# Patient Record
Sex: Female | Born: 1980 | Race: Black or African American | Hispanic: No | Marital: Single | Smoking: Current every day smoker
Health system: Southern US, Community
[De-identification: ages and names within clinical notes are randomized; demographics above are authoritative.]

## PROBLEM LIST (undated history)

## (undated) DIAGNOSIS — D649 Anemia, unspecified: Secondary | ICD-10-CM

## (undated) HISTORY — DX: Anemia, unspecified: D64.9

---

## 2019-07-03 ENCOUNTER — Emergency Department (HOSPITAL_COMMUNITY)
Admission: EM | Admit: 2019-07-03 | Discharge: 2019-07-03 | Disposition: A | Discharge status: Home / Self Care | Payer: Self-pay | Attending: Emergency Medicine | Admitting: Emergency Medicine

## 2019-07-03 ENCOUNTER — Other Ambulatory Visit: Payer: Self-pay

## 2019-07-03 DIAGNOSIS — K0889 Other specified disorders of teeth and supporting structures: Secondary | ICD-10-CM | POA: Insufficient documentation

## 2019-07-03 MED ORDER — NAPROXEN 250 MG PO TABS
500.0000 mg | ORAL_TABLET | Freq: Once | ORAL | Status: AC
  Administered 2019-07-03: 500 mg via ORAL
  Filled 2019-07-03: qty 2

## 2019-07-03 MED ORDER — PENICILLIN V POTASSIUM 250 MG PO TABS
500.0000 mg | ORAL_TABLET | Freq: Once | ORAL | Status: AC
  Administered 2019-07-03: 500 mg via ORAL
  Filled 2019-07-03: qty 2

## 2019-07-03 MED ORDER — NAPROXEN 500 MG PO TABS: 500.0000 mg | ORAL_TABLET | Freq: Two times a day (BID) | ORAL | 0 refills | Status: DC

## 2019-07-03 MED ORDER — PENICILLIN V POTASSIUM 500 MG PO TABS: 500.0000 mg | ORAL_TABLET | Freq: Four times a day (QID) | ORAL | 0 refills | Status: AC

## 2019-07-03 NOTE — ED Provider Notes (Signed)
[facility] EMERGENCY DEPARTMENT Provider Note   CSN: [id number] Arrival date & time: 07/03/19  1551     History   Chief Complaint Chief Complaint  Patient presents with  . Dental Pain    HPI Connie Mcgee is a 38 y.o. female who presents with dental pain.  No significant past medical history.  Patient states that she has pain in her upper and lower molars on the left side.  The pain started 2 days ago.  The pain is constant and worse with eating. It radiates to her L ear.  Nothing makes it better.  She has not tried any medicines over-the-counter.  She reports associated swelling.  No fevers.  She does have a dentist but has not seen them in a couple years.     HPI  No past medical history on file.  There are no active problems to display for this patient.    The histories are not reviewed yet. Please review them in the "History" navigator section and refresh this [place].   OB History   No obstetric history on file.      Home Medications    Prior to Admission medications   Not on File    Family History No family history on file.  Social History Social History   Tobacco Use  . Smoking status: Not on file  Substance Use Topics  . Alcohol use: Not on file  . Drug use: Not on file     Allergies   Patient has no allergy information on record.   Review of Systems Review of Systems  Constitutional: Negative for fever.  HENT: Positive for dental problem.      Physical Exam Updated Vital Signs BP 135/83 (BP Location: Right Arm)   Pulse 87   Temp 99.1 F (37.3 C) (Oral)   Resp 16   LMP 06/10/2019   SpO2 100%   Physical Exam Vitals signs and nursing note reviewed.  Constitutional:      General: She is not in acute distress.    Appearance: Normal appearance. She is well-developed. She is not ill-appearing.  HENT:     Head: Normocephalic and atraumatic.     Jaw: Trismus (mild) present.     Right Ear: Tympanic membrane  normal.     Left Ear: Tympanic membrane normal.     Mouth/Throat:     Lips: Pink.     Mouth: Mucous membranes are moist.     Dentition: Abnormal dentition (left lower wisdom tooth is broken).     Pharynx: Oropharynx is clear.  Eyes:     General: No scleral icterus.       Right eye: No discharge.        Left eye: No discharge.     Conjunctiva/sclera: Conjunctivae normal.     Pupils: Pupils are equal, round, and reactive to light.  Neck:     Musculoskeletal: Normal range of motion.  Cardiovascular:     Rate and Rhythm: Normal rate.  Pulmonary:     Effort: Pulmonary effort is normal. No respiratory distress.  Abdominal:     General: There is no distension.  Skin:    General: Skin is warm and dry.  Neurological:     Mental Status: She is alert and oriented to person, place, and time.  Psychiatric:        Behavior: Behavior normal.      ED Treatments / Results  Labs (all labs ordered are listed, but only abnormal results  are displayed) Labs Reviewed - No data to display  EKG None  Radiology No results found.  Procedures Procedures (including critical care time)  Medications Ordered in ED Medications - No data to display   Initial Impression / Assessment and Plan / ED Course  I have reviewed the triage vital signs and the nursing notes.  Pertinent labs & imaging results that were available during my care of the patient were reviewed by me and considered in my medical decision making (see chart for details).  Dental pain associated with dental caries and possible dental infection. Patient is afebrile, non toxic appearing, and swallowing secretions well. No concerning findings on exam. No obvious abscess and doubt deep space head or neck infection.  She has a Education officer, communitydentist and was encouraged to follow up with them. She was given an rx for PCN and Naproxen. Pt given return precautions.  Pt verbalizes understanding and agrees with plan.   Final Clinical Impressions(s) / ED  Diagnoses   Final diagnoses:  Pain, dental    ED Discharge Orders    None       Bethel BornGekas, Elmer Merwin Marie, PA-C 07/03/19 1814    Blane OharaZavitz, Joshua, MD 07/04/19 0028

## 2019-07-03 NOTE — Discharge Instructions (Signed)
Take Penicillin 4 times daily for the next week Take Naproxen twice a day for the next week for pain and swelling Please follow up with your dentist

## 2019-07-03 NOTE — ED Triage Notes (Signed)
Per pt she has been having teeth pain in the left upper jaw and bottom lower jaw. It has been hurting for 2 days now. Swelling in the jaw and hard to chew.

## 2020-11-30 ENCOUNTER — Encounter: Payer: Self-pay | Admitting: Obstetrics and Gynecology

## 2020-12-10 NOTE — L&D Delivery Note (Signed)
OB/GYN Faculty Practice Delivery Note  Connie Mcgee is a 40 y.o. G4P3 s/p vaginal delivery at [redacted]w[redacted]d. She was admitted for IOL secondary to [name].   ROM: 9h 40m with  fluid GBS Status: negative Maximum Maternal Temperature: 98.27F  Labor Progress: On admission, FB was placed and pitocin was started. AROM for clear fluid at 2055 on 5/10. She then progressed to complete cervical dilation with up-titration of pitocin. She then had an uncomplicated delivery as noted below.  Delivery Date/Time: 04/19/21 on 0620 Delivery: Called to room and patient was complete and pushing. Head delivered LOA. Tight nuchal cord x2 present; reduced s/p delivery. Shoulder and body delivered in usual fashion. Infant with spontaneous cry, placed on mother's abdomen, dried and stimulated. Cord clamped x 2 after 1-minute delay, and cut by author under my direct supervision. Cord blood drawn. Placenta delivered spontaneously with gentle cord traction. Fundus firm with massage and Pitocin. Labia, perineum, vagina, and cervix were inspected, without evidence of lacerations.  Placenta: 3-vessel cord, intact, sent to L&D Complications: none Lacerations: none EBL: 50 ml Analgesia: epidural  Infant: viable female  APGARs 8 & 9  weight 3055 g  Post-Placental IUD Insertion Procedure Note  Patient identified, informed consent signed prior to delivery, signed copy in chart, time out was performed.    Vaginal, labial and perineal areas thoroughly inspected, without evidence for lacerations.  - Liletta IUD grasped between sterile gloved fingers. Sterile lubrication applied to sterile gloved hand for ease of insertion. Fundus identified through abdominal wall using non-insertion hand. IUD inserted to fundus with bimanual technique. IUD carefully released at the fundus and insertion hand gently removed from vagina.   Strings trimmed to the level of the introitus. Patient tolerated procedure well.  Lot #: [id number] Exp:  06/08/2024  Patient given post procedure instructions and IUD care card with expiration date.  Patient is asked to keep IUD strings tucked in her vagina until her postpartum follow up visit in 4-6 weeks. Patient advised to abstain from sexual intercourse and pulling on strings before her follow-up visit. Patient verbalized an understanding of the plan of care and agrees.   [name], MD OB Fellow, Faculty Practice 04/19/2021 9:18 AM

## 2020-12-20 ENCOUNTER — Other Ambulatory Visit: Payer: Self-pay

## 2020-12-20 ENCOUNTER — Ambulatory Visit (INDEPENDENT_AMBULATORY_CARE_PROVIDER_SITE_OTHER): Payer: Medicaid Other | Admitting: Obstetrics and Gynecology

## 2020-12-20 ENCOUNTER — Encounter: Payer: Self-pay | Admitting: Obstetrics and Gynecology

## 2020-12-20 VITALS — BP 116/76 | HR 90 | Ht 62.0 in | Wt 180.0 lb

## 2020-12-20 DIAGNOSIS — Z8759 Personal history of other complications of pregnancy, childbirth and the puerperium: Secondary | ICD-10-CM

## 2020-12-20 DIAGNOSIS — O0990 Supervision of high risk pregnancy, unspecified, unspecified trimester: Secondary | ICD-10-CM | POA: Insufficient documentation

## 2020-12-20 DIAGNOSIS — Z98891 History of uterine scar from previous surgery: Secondary | ICD-10-CM | POA: Insufficient documentation

## 2020-12-20 NOTE — Progress Notes (Signed)
Subjective:  Connie Mcgee is a 40 y.o. G4P3 at [redacted]w[redacted]d being seen today for ongoing prenatal care. Transferred from the [facility] d/t H/O IUGR in the past. H/O c section in 2008 d/t persistent OP.  She is currently monitored for the following issues for this high-risk pregnancy and has Supervision of high risk pregnancy, antepartum and History of cesarean section on their problem list.  Patient reports no complaints.  Contractions: Not present. Vag. Bleeding: None.  Movement: Present. Denies leaking of fluid.   The following portions of the patient's history were reviewed and updated as appropriate: allergies, current medications, past family history, past medical history, past social history, past surgical history and problem list. Problem list updated.  Objective:   Vitals:   12/20/20 1641 12/20/20 1644  BP: 116/76   Pulse: 90   Weight: 180 lb (81.6 kg)   Height:  5\' 2"  (1.575 m)    Fetal Status: Fetal Heart Rate (bpm): 155   Movement: Present     General:  Alert, oriented and cooperative. Patient is in no acute distress.  Skin: Skin is warm and dry. No rash noted.   Cardiovascular: Normal heart rate noted  Respiratory: Normal respiratory effort, no problems with respiration noted  Abdomen: Soft, gravid, appropriate for gestational age. Pain/Pressure: Present     Pelvic:  Cervical exam deferred        Extremities: Normal range of motion.     Mental Status: Normal mood and affect. Normal behavior. Normal judgment and thought content.   Urinalysis:      Assessment and Plan:  Pregnancy: G4P3 at [redacted]w[redacted]d  1. Supervision of high risk pregnancy, antepartum Stable Glucola next visit Growth scan ordered  2. History of cesarean section Signed for released of medical records for OP  To discuss in more detailed at later  3. History of prior pregnancy with IUGR newborn  - [redacted]w[redacted]d MFM OB DETAIL +14 WK; Future  Preterm labor symptoms and general obstetric precautions including but not  limited to vaginal bleeding, contractions, leaking of fluid and fetal movement were reviewed in detail with the patient. Please refer to After Visit Summary for other counseling recommendations.  Return in about 4 weeks (around 01/17/2021) for ROB w/ glucose test, OB visit, face to face, MD only.   03/17/2021, MD

## 2020-12-20 NOTE — Patient Instructions (Signed)

## 2021-01-17 ENCOUNTER — Other Ambulatory Visit: Payer: Self-pay

## 2021-01-17 ENCOUNTER — Other Ambulatory Visit: Payer: Medicaid Other

## 2021-01-17 ENCOUNTER — Ambulatory Visit (INDEPENDENT_AMBULATORY_CARE_PROVIDER_SITE_OTHER): Payer: Medicaid Other | Admitting: Obstetrics and Gynecology

## 2021-01-17 VITALS — BP 113/76 | HR 108 | Wt 184.3 lb

## 2021-01-17 DIAGNOSIS — Z23 Encounter for immunization: Secondary | ICD-10-CM | POA: Diagnosis not present

## 2021-01-17 DIAGNOSIS — O0990 Supervision of high risk pregnancy, unspecified, unspecified trimester: Secondary | ICD-10-CM

## 2021-01-17 DIAGNOSIS — Z98891 History of uterine scar from previous surgery: Secondary | ICD-10-CM

## 2021-01-17 DIAGNOSIS — Z3A26 26 weeks gestation of pregnancy: Secondary | ICD-10-CM

## 2021-01-17 NOTE — Progress Notes (Signed)
Patient presents for ROB and GTT. Patient has no concerns today. Tdap vaccine given today.

## 2021-01-17 NOTE — Progress Notes (Signed)
   PRENATAL VISIT NOTE  Subjective:  Connie Mcgee is a 40 y.o. G4P3 at [redacted]w[redacted]d being seen today for ongoing prenatal care.  She is currently monitored for the following issues for this high-risk pregnancy and has Supervision of high risk pregnancy, antepartum; History of cesarean section; and [redacted] weeks gestation of pregnancy on their problem list.  Patient doing well with no acute concerns today. She reports no complaints.  Contractions: Not present. Vag. Bleeding: None.  Movement: Present. Denies leaking of fluid.   The following portions of the patient's history were reviewed and updated as appropriate: allergies, current medications, past family history, past medical history, past social history, past surgical history and problem list. Problem list updated.  Objective:   Vitals:   01/17/21 0854  BP: 113/76  Pulse: (!) 108  Weight: 184 lb 4.8 oz (83.6 kg)    Fetal Status: Fetal Heart Rate (bpm): 153   Movement: Present     General:  Alert, oriented and cooperative. Patient is in no acute distress.  Skin: Skin is warm and dry. No rash noted.   Cardiovascular: Normal heart rate noted  Respiratory: Normal respiratory effort, no problems with respiration noted  Abdomen: Soft, gravid, appropriate for gestational age.  Pain/Pressure: Absent     Pelvic: Cervical exam deferred        Extremities: Normal range of motion.  Edema: None  Mental Status:  Normal mood and affect. Normal behavior. Normal judgment and thought content.   Assessment and Plan:  Pregnancy: G4P3 at [redacted]w[redacted]d  1. Supervision of high risk pregnancy, antepartum  - Glucose Tolerance, 2 Hours w/1 Hour - CBC - HIV Antibody (routine testing w rflx) - RPR - Tdap vaccine greater than or equal to 7yo IM  2. History of cesarean section Discussed repeat c/s versus VBAC in detail.  Pt good candidate for either.  She will think about it and discuss with family.  Can get formal consult and consent next visit  3. [redacted] weeks  gestation of pregnancy   Preterm labor symptoms and general obstetric precautions including but not limited to vaginal bleeding, contractions, leaking of fluid and fetal movement were reviewed in detail with the patient.  Please refer to After Visit Summary for other counseling recommendations.   Return in about 2 weeks (around 01/31/2021) for [facility], in person.   Mariel Aloe, MD

## 2021-01-17 NOTE — Patient Instructions (Signed)
Vaginal Birth After Cesarean Delivery  Vaginal birth after cesarean delivery (VBAC) is giving birth vaginally after previously delivering a baby through a cesarean section (C-section). A VBAC may be a safe option for you, depending on your health and other factors. It is important to discuss VBAC with your health care provider early in your pregnancy so you can understand the risks, benefits, and options. Having these discussions early will give you time to make your birth plan. Who are the best candidates for VBAC? The best candidates for VBAC are women who:  Have had one or two prior cesarean deliveries, and the incision made during the delivery was horizontal (low transverse).  Do not have a vertical (classical) scar on their uterus.  Have not had a tear in the wall of their uterus (uterine rupture).  Plan to have more pregnancies. A VBAC is also more likely to be successful:  In women who have previously given birth vaginally.  When labor starts by itself (spontaneously) before the due date. What are the benefits of VBAC? The benefits of delivering your baby vaginally instead of by a cesarean delivery include:  A shorter hospital stay.  A faster recovery time.  Less pain.  Avoiding risks associated with major surgery, such as infection and blood clots.  Less blood loss and less need for donated blood (transfusions). What are the risks of VBAC? The main risk of attempting a VBAC is that it may fail, forcing your health care provider to deliver your baby by a C-section. Other risks are rare and include:  Tearing (rupture) of the scar from a past cesarean delivery.  Other risks associated with vaginal deliveries. If a repeat cesarean delivery is needed, the risks include:  Blood loss.  Infection.  Blood clot.  Damage to surrounding organs.  Removal of the uterus (hysterectomy), if it is damaged.  Placenta problems in future pregnancies. What else should I know  about my options? Delivering a baby through a VBAC is similar to having a normal spontaneous vaginal delivery. Therefore, it is safe:  To try with twins.  For your health care provider to try to turn the baby from a breech position (external cephalic version) during labor.  With epidural analgesia for pain relief. Consider where you would like to deliver your baby. VBAC should be attempted in facilities where an emergency cesarean delivery can be performed. VBAC is not recommended for home births. Any changes in your health or your baby's health during your pregnancy may make it necessary to change your initial decision about VBAC. Your health care provider may recommend that you do not attempt a VBAC if:  Your baby's suspected weight is 8.8 lb (4 kg) or more.  You have preeclampsia. This is a condition that causes high blood pressure along with other symptoms, such as swelling and headaches.  You will have VBAC less than 19 months after your cesarean delivery.  You are past your due date.  You need to have labor started (induced) because your cervix is not ready for labor (unfavorable). Where to find more information  American Pregnancy Association: americanpregnancy.org  American Congress of Obstetricians and Gynecologists: acog.org Summary  Vaginal birth after cesarean delivery (VBAC) is giving birth vaginally after previously delivering a baby through a cesarean section (C-section). A VBAC may be a safe option for you, depending on your health and other factors.  Discuss VBAC with your health care provider early in your pregnancy so you can understand the risks, benefits, options, and   have plenty of time to make your birth plan.  The main risk of attempting a VBAC is that it may fail, forcing your health care provider to deliver your baby by a C-section. Other risks are rare. This information is not intended to replace advice given to you by your health care provider. Make sure  you discuss any questions you have with your health care provider. Document Revised: 03/24/2019 Document Reviewed: 03/05/2017 Elsevier Patient Education  2021 Elsevier Inc.  

## 2021-01-18 ENCOUNTER — Other Ambulatory Visit: Payer: Self-pay

## 2021-01-18 DIAGNOSIS — O24410 Gestational diabetes mellitus in pregnancy, diet controlled: Secondary | ICD-10-CM

## 2021-01-18 LAB — CBC
Hematocrit: 29.9 % — ABNORMAL LOW (ref 34.0–46.6)
Hemoglobin: 9.7 g/dL — ABNORMAL LOW (ref 11.1–15.9)
MCH: 29 pg (ref 26.6–33.0)
MCHC: 32.4 g/dL (ref 31.5–35.7)
MCV: 89 fL (ref 79–97)
Platelets: 271 10*3/uL (ref 150–450)
RBC: 3.35 x10E6/uL — ABNORMAL LOW (ref 3.77–5.28)
RDW: 12.6 % (ref 11.7–15.4)
WBC: 9.4 10*3/uL (ref 3.4–10.8)

## 2021-01-18 LAB — GLUCOSE TOLERANCE, 2 HOURS W/ 1HR
Glucose, 1 hour: 185 mg/dL — ABNORMAL HIGH (ref 65–179)
Glucose, 2 hour: 167 mg/dL — ABNORMAL HIGH (ref 65–152)
Glucose, Fasting: 98 mg/dL — ABNORMAL HIGH (ref 65–91)

## 2021-01-18 LAB — HIV ANTIBODY (ROUTINE TESTING W REFLEX): HIV Screen 4th Generation wRfx: NONREACTIVE

## 2021-01-18 LAB — RPR: RPR Ser Ql: NONREACTIVE

## 2021-01-18 MED ORDER — ACCU-CHEK GUIDE VI STRP: ORAL_STRIP | 12 refills | Status: DC

## 2021-01-18 MED ORDER — ACCU-CHEK GUIDE W/DEVICE KIT: 1.0000 | PACK | Freq: Four times a day (QID) | 0 refills | Status: AC

## 2021-01-18 MED ORDER — ACCU-CHEK SOFTCLIX LANCETS MISC: 2 refills | Status: DC

## 2021-01-18 NOTE — Telephone Encounter (Signed)
Patient has been informed of test results and referral place to Diabetes and Nutrition.

## 2021-01-31 ENCOUNTER — Other Ambulatory Visit: Payer: Self-pay

## 2021-01-31 ENCOUNTER — Ambulatory Visit: Payer: Medicaid Other | Admitting: Registered"

## 2021-01-31 ENCOUNTER — Encounter: Payer: Medicaid Other | Admitting: Obstetrics & Gynecology

## 2021-01-31 ENCOUNTER — Encounter: Payer: Medicaid Other | Attending: Obstetrics and Gynecology | Admitting: Registered"

## 2021-01-31 DIAGNOSIS — O24410 Gestational diabetes mellitus in pregnancy, diet controlled: Secondary | ICD-10-CM | POA: Insufficient documentation

## 2021-01-31 DIAGNOSIS — O24419 Gestational diabetes mellitus in pregnancy, unspecified control: Secondary | ICD-10-CM

## 2021-02-01 ENCOUNTER — Other Ambulatory Visit: Payer: Self-pay

## 2021-02-01 ENCOUNTER — Ambulatory Visit: Payer: Medicaid Other | Admitting: *Deleted

## 2021-02-01 ENCOUNTER — Ambulatory Visit: Payer: Medicaid Other | Attending: Obstetrics and Gynecology

## 2021-02-01 ENCOUNTER — Ambulatory Visit (INDEPENDENT_AMBULATORY_CARE_PROVIDER_SITE_OTHER): Payer: Medicaid Other | Admitting: Obstetrics and Gynecology

## 2021-02-01 ENCOUNTER — Encounter: Payer: Self-pay | Admitting: Obstetrics and Gynecology

## 2021-02-01 VITALS — BP 120/68 | HR 106

## 2021-02-01 VITALS — BP 100/68 | HR 103 | Wt 186.1 lb

## 2021-02-01 DIAGNOSIS — O24410 Gestational diabetes mellitus in pregnancy, diet controlled: Secondary | ICD-10-CM | POA: Diagnosis not present

## 2021-02-01 DIAGNOSIS — O99333 Smoking (tobacco) complicating pregnancy, third trimester: Secondary | ICD-10-CM

## 2021-02-01 DIAGNOSIS — O24419 Gestational diabetes mellitus in pregnancy, unspecified control: Secondary | ICD-10-CM

## 2021-02-01 DIAGNOSIS — Z8759 Personal history of other complications of pregnancy, childbirth and the puerperium: Secondary | ICD-10-CM | POA: Insufficient documentation

## 2021-02-01 DIAGNOSIS — Z363 Encounter for antenatal screening for malformations: Secondary | ICD-10-CM | POA: Diagnosis not present

## 2021-02-01 DIAGNOSIS — O0933 Supervision of pregnancy with insufficient antenatal care, third trimester: Secondary | ICD-10-CM | POA: Diagnosis not present

## 2021-02-01 DIAGNOSIS — Z98891 History of uterine scar from previous surgery: Secondary | ICD-10-CM

## 2021-02-01 DIAGNOSIS — O34219 Maternal care for unspecified type scar from previous cesarean delivery: Secondary | ICD-10-CM

## 2021-02-01 DIAGNOSIS — O09529 Supervision of elderly multigravida, unspecified trimester: Secondary | ICD-10-CM

## 2021-02-01 DIAGNOSIS — O321XX0 Maternal care for breech presentation, not applicable or unspecified: Secondary | ICD-10-CM

## 2021-02-01 DIAGNOSIS — O0990 Supervision of high risk pregnancy, unspecified, unspecified trimester: Secondary | ICD-10-CM

## 2021-02-01 DIAGNOSIS — O09293 Supervision of pregnancy with other poor reproductive or obstetric history, third trimester: Secondary | ICD-10-CM

## 2021-02-01 DIAGNOSIS — Z3A28 28 weeks gestation of pregnancy: Secondary | ICD-10-CM

## 2021-02-01 DIAGNOSIS — O09523 Supervision of elderly multigravida, third trimester: Secondary | ICD-10-CM

## 2021-02-01 NOTE — Progress Notes (Signed)
Pt does not have blood sugars with her. Pt states she took blood sugar around 1:30pm yesterday and the reading was 109

## 2021-02-01 NOTE — Progress Notes (Signed)
   PRENATAL VISIT NOTE  Subjective:  Connie Mcgee is a 40 y.o. G4P3 at 91w1dbeing seen today for ongoing prenatal care.  She is currently monitored for the following issues for this high-risk pregnancy and has Supervision of high risk pregnancy, antepartum; History of cesarean section; [redacted] weeks gestation of pregnancy; and Gestational diabetes mellitus (GDM), antepartum on their problem list.  Patient reports no complaints.  Contractions: Not present. Vag. Bleeding: None.  Movement: Present. Denies leaking of fluid.   The following portions of the patient's history were reviewed and updated as appropriate: allergies, current medications, past family history, past medical history, past social history, past surgical history and problem list.   Objective:   Vitals:   02/01/21 0903  BP: 100/68  Pulse: (!) 103  Weight: 186 lb 1.6 oz (84.4 kg)    Fetal Status: Fetal Heart Rate (bpm): 154   Movement: Present     General:  Alert, oriented and cooperative. Patient is in no acute distress.  Skin: Skin is warm and dry. No rash noted.   Cardiovascular: Normal heart rate noted  Respiratory: Normal respiratory effort, no problems with respiration noted  Abdomen: Soft, gravid, appropriate for gestational age.  Pain/Pressure: Absent     Pelvic: Cervical exam deferred        Extremities: Normal range of motion.     Mental Status: Normal mood and affect. Normal behavior. Normal judgment and thought content.   Assessment and Plan:  Pregnancy: G4P3 at 273w1d1. History of cesarean section - op report not available - pt reports she was induced for post dates and had CS due to face presentation - was never told she could not have TOLAC - Reviewed risks/benefits of TOLAC versus RCS in detail. Patient counseled regarding potential vaginal delivery, chance of success, future implications, possible uterine rupture and need for urgent/emergent repeat cesarean. Counseled regarding potential need for  repeat c-section for reasons unrelated to first c-section. Counseled regarding scheduled repeat cesarean including risks of bleeding, infection, damage to surrounding tissue, abnormal placentation, implications for future pregnancies. All questions answered.  Patient desires TOLAC, consent signed 02/01/2021.  2. Supervision of high risk pregnancy, antepartum  3. Gestational diabetes mellitus (GDM), antepartum, gestational diabetes method of control unspecified - Reports fasting range 79-110 - Has not been checking post prandial - Met with AnLevada Dyesterday and had counseling about importance of checking 4x daily and improving diet  4. [redacted] weeks gestation of pregnancy   Preterm labor symptoms and general obstetric precautions including but not limited to vaginal bleeding, contractions, leaking of fluid and fetal movement were reviewed in detail with the patient. Please refer to After Visit Summary for other counseling recommendations.   Return in about 2 weeks (around 02/15/2021) for high OB, in person.  Future Appointments  Date Time Provider [place]2/23/2022  2:00 PM [facility]/2022  2:15 PM [facility] US2 [facility]  KeSloan LeiterMD

## 2021-02-01 NOTE — Progress Notes (Signed)
Patient was seen on 01/31/21 for Gestational Diabetes self-management. EDD 04/25/21. Patient states no history of GDM. Diet history obtained. Patient eats variety of all food groups and has cut back sweets and stopped drinking soda. Beverages include water.  Patient reports no structured physical activity, states she has a long hallway and walks it to use bathroom 10-15 times per day.  The following learning objectives were met by the patient :   States the definition of Gestational Diabetes  States why dietary management is important in controlling blood glucose  Describes the effects of carbohydrates on blood glucose levels  Demonstrates ability to create a balanced meal plan  Demonstrates carbohydrate counting   States when to check blood glucose levels  Demonstrates proper blood glucose monitoring techniques  States the effect of stress and exercise on blood glucose levels  States the importance of limiting caffeine and abstaining from alcohol and smoking  Plan:  Aim for 3 Carbohydrate Choices per meal (45 grams) +/- 1 either way  Aim for 1-2 Carbohydrate Choices per snack Begin reading food labels for Total Carbohydrate of foods If OK with your MD, consider  increasing your activity level by walking, Arm Chair Exercises or other activity daily as tolerated Begin checking Blood Glucose before breakfast and 2 hours after first bite of breakfast, lunch and dinner as directed by MD  Bring Log Book/Sheet and meter to every medical appointment  Baby Scripts: (BS 2.0 not capable of glucose management at this time.) Patient to record blood sugar on glucose log sheet  Take medication if directed by MD  Patient already has a meter, is testing fasting and randomly in the afternoon. Pt reports reading range 75-109 mg/dL  Patient instructed to monitor glucose levels: FBS: 60 - 95 mg/dl 2 hour: <120 mg/dl  Patient received the following handouts:  Nutrition Diabetes and  Pregnancy  Carbohydrate Counting List  Blood glucose Log Sheet  Patient will be seen for follow-up as needed. 

## 2021-02-02 ENCOUNTER — Other Ambulatory Visit: Payer: Self-pay | Admitting: *Deleted

## 2021-02-02 DIAGNOSIS — O24410 Gestational diabetes mellitus in pregnancy, diet controlled: Secondary | ICD-10-CM

## 2021-02-15 ENCOUNTER — Encounter: Payer: Medicaid Other | Admitting: Obstetrics and Gynecology

## 2021-02-21 ENCOUNTER — Ambulatory Visit (INDEPENDENT_AMBULATORY_CARE_PROVIDER_SITE_OTHER): Payer: Medicaid Other | Admitting: Obstetrics and Gynecology

## 2021-02-21 ENCOUNTER — Other Ambulatory Visit: Payer: Self-pay

## 2021-02-21 VITALS — BP 113/76 | HR 111 | Wt 180.0 lb

## 2021-02-21 DIAGNOSIS — O09523 Supervision of elderly multigravida, third trimester: Secondary | ICD-10-CM | POA: Insufficient documentation

## 2021-02-21 DIAGNOSIS — Z3A31 31 weeks gestation of pregnancy: Secondary | ICD-10-CM | POA: Insufficient documentation

## 2021-02-21 DIAGNOSIS — O24410 Gestational diabetes mellitus in pregnancy, diet controlled: Secondary | ICD-10-CM

## 2021-02-21 DIAGNOSIS — Z98891 History of uterine scar from previous surgery: Secondary | ICD-10-CM

## 2021-02-21 DIAGNOSIS — O0990 Supervision of high risk pregnancy, unspecified, unspecified trimester: Secondary | ICD-10-CM

## 2021-02-21 NOTE — Progress Notes (Signed)
   PRENATAL VISIT NOTE  Subjective:  Connie Mcgee is a 40 y.o. G4P3 at [redacted]w[redacted]d being seen today for ongoing prenatal care.  She is currently monitored for the following issues for this high-risk pregnancy and has Supervision of high risk pregnancy, antepartum; History of cesarean section; [redacted] weeks gestation of pregnancy; Gestational diabetes mellitus (GDM), antepartum; and [redacted] weeks gestation of pregnancy on their problem list.  Patient doing well with no acute concerns today. She reports no complaints.  Contractions: Not present. Vag. Bleeding: None.  Movement: Present. Denies leaking of fluid.   Pt did not bring blood sugars today.  She notes FBS: avg 96 PPBS: 94-95, rarely reaches the 100s  The following portions of the patient's history were reviewed and updated as appropriate: allergies, current medications, past family history, past medical history, past social history, past surgical history and problem list. Problem list updated.  Objective:   Vitals:   02/21/21 1312  BP: 113/76  Pulse: (!) 111  Weight: 180 lb (81.6 kg)    Fetal Status: Fetal Heart Rate (bpm): 155 Fundal Height: 31 cm Movement: Present     General:  Alert, oriented and cooperative. Patient is in no acute distress.  Skin: Skin is warm and dry. No rash noted.   Cardiovascular: Normal heart rate noted  Respiratory: Normal respiratory effort, no problems with respiration noted  Abdomen: Soft, gravid, appropriate for gestational age.  Pain/Pressure: Absent     Pelvic: Cervical exam deferred        Extremities: Normal range of motion.  Edema: None  Mental Status:  Normal mood and affect. Normal behavior. Normal judgment and thought content.   Assessment and Plan:  Pregnancy: G4P3 at [redacted]w[redacted]d  1. Supervision of high risk pregnancy, antepartum pt followed by MFM, has repeat scan on 03/01/21  2. Diet controlled gestational diabetes mellitus (GDM), antepartum Decent control of blood sugars, pt advised to bring in  flowsheet next time  3. [redacted] weeks gestation of pregnancy   4. History of cesarean section Pt desires TOLAC, previously educated and consented  Preterm labor symptoms and general obstetric precautions including but not limited to vaginal bleeding, contractions, leaking of fluid and fetal movement were reviewed in detail with the patient.  Please refer to After Visit Summary for other counseling recommendations.   Return in about 2 weeks (around 03/07/2021) for [facility], in person.   Mariel Aloe, MD Faculty Attending Center for [facility]

## 2021-02-21 NOTE — Patient Instructions (Signed)
Gestational Diabetes Mellitus, Diagnosis Gestational diabetes mellitus is a form of diabetes. It can happen when you are pregnant. The diabetes goes away after you give birth. If you do not get treated for this condition, it may cause problems for you or your baby. What are the causes? This condition is caused by changes in your body when you are pregnant. When these happen:  A part of the body called the pancreas does not make enough insulin.  The body cannot use insulin in the right way. Sugars cannot get into cells in your body. The sugars stay in your blood. This leads to high blood sugar.   What increases the risk?  Being older than age 25 when pregnant.  Having someone with diabetes in your family.  Too much body weight.  Having had this condition in the past.  Polycystic ovary syndrome.  Being pregnant with more than one baby. What are the signs or symptoms?  Being thirsty often.  Being hungry often.  Needing to pee more often. How is this treated?  Eat a healthy diet.  Get more exercise.  Check your blood sugar often.  Take insulin and other medicines, if needed.  Work with an expert on this condition, if told. Follow these instructions at home: Learn about your diabetes Ask your doctor:  How often should I check my blood sugar? Where do I get the equipment?  What medicines do I need? When should I take them?  Do I need to meet with an educator?  Who can I call if I have questions?  Where can I find a support group? General instructions  Take medicines only as told by your doctor.  Stay at a healthy weight.  Drink enough fluid to keep your pee pale yellow.  Wear an alert bracelet or carry a card that shows you have this condition.  Keep all follow-up visits. Where to find more information  American Diabetes Association (ADA): diabetes.org  Association of Diabetes Care & Education Specialists (ADCES): diabeteseducator.org  Centers for  Disease Control and Prevention (CDC): cdc.gov  American Pregnancy Association: americanpregnancy.org  [place] Department of Agriculture MyPlate: myplate.gov Contact a doctor if:  Your blood sugar is at or above 240 mg/dL (13.3 mmol/L).  Your blood sugar is at or above 200 mg/dL (11.1 mmol/L) and you have ketones in your pee.  You have a fever.  You are sick for 2 days or more and you do not get better.  You have either of these problems for more than 6 hours: ? You vomit every time you eat or drink. ? You have watery poop (diarrhea). Get help right away if:  You cannot think clearly.  You are not breathing well.  You have a lot of ketones in your pee.  Your baby seems to move less than normal.  Abnormal fluid or blood starts to come out of your vagina.  You start having contractions before your due date. You may feel your belly tighten.  You have a very bad headache. These symptoms may be an emergency. Get help right away. Call your local emergency services (911 in the [place]  Do not wait to see if the symptoms will go away.  Do not drive yourself to the hospital. Summary  Gestational diabetes is a form of diabetes. It can happen when you are pregnant.  This condition occurs when your body cannot make or use insulin in the right way.  Eat a healthy diet, exercise, and use medicines or insulin   as told by your doctor.  Tell your doctor if your blood sugar is high, you have a fever, or you vomit every time you eat or drink.  Get help right away if you cannot think clearly, you are not breathing well, or your baby seems to move less than normal. This information is not intended to replace advice given to you by your health care provider. Make sure you discuss any questions you have with your health care provider. Document Revised: 05/02/2020 Document Reviewed: 05/02/2020 Elsevier Patient Education  2021 Elsevier Inc.  

## 2021-03-01 ENCOUNTER — Ambulatory Visit: Payer: Medicaid Other | Admitting: *Deleted

## 2021-03-01 ENCOUNTER — Ambulatory Visit: Payer: Medicaid Other | Attending: Obstetrics and Gynecology

## 2021-03-01 ENCOUNTER — Encounter: Payer: Self-pay | Admitting: *Deleted

## 2021-03-01 ENCOUNTER — Other Ambulatory Visit: Payer: Self-pay

## 2021-03-01 VITALS — BP 102/65 | HR 97

## 2021-03-01 DIAGNOSIS — Z3A32 32 weeks gestation of pregnancy: Secondary | ICD-10-CM

## 2021-03-01 DIAGNOSIS — Z363 Encounter for antenatal screening for malformations: Secondary | ICD-10-CM | POA: Diagnosis not present

## 2021-03-01 DIAGNOSIS — O0933 Supervision of pregnancy with insufficient antenatal care, third trimester: Secondary | ICD-10-CM

## 2021-03-01 DIAGNOSIS — O99333 Smoking (tobacco) complicating pregnancy, third trimester: Secondary | ICD-10-CM | POA: Diagnosis not present

## 2021-03-01 DIAGNOSIS — O34219 Maternal care for unspecified type scar from previous cesarean delivery: Secondary | ICD-10-CM

## 2021-03-01 DIAGNOSIS — O24410 Gestational diabetes mellitus in pregnancy, diet controlled: Secondary | ICD-10-CM

## 2021-03-01 DIAGNOSIS — O09523 Supervision of elderly multigravida, third trimester: Secondary | ICD-10-CM | POA: Insufficient documentation

## 2021-03-01 DIAGNOSIS — O09293 Supervision of pregnancy with other poor reproductive or obstetric history, third trimester: Secondary | ICD-10-CM

## 2021-03-01 DIAGNOSIS — F17210 Nicotine dependence, cigarettes, uncomplicated: Secondary | ICD-10-CM

## 2021-03-02 ENCOUNTER — Other Ambulatory Visit: Payer: Self-pay | Admitting: *Deleted

## 2021-03-02 DIAGNOSIS — O24410 Gestational diabetes mellitus in pregnancy, diet controlled: Secondary | ICD-10-CM

## 2021-03-07 ENCOUNTER — Other Ambulatory Visit: Payer: Self-pay

## 2021-03-07 ENCOUNTER — Ambulatory Visit (INDEPENDENT_AMBULATORY_CARE_PROVIDER_SITE_OTHER): Payer: Medicaid Other | Admitting: Obstetrics & Gynecology

## 2021-03-07 VITALS — BP 116/75 | HR 97 | Wt 184.0 lb

## 2021-03-07 DIAGNOSIS — Z98891 History of uterine scar from previous surgery: Secondary | ICD-10-CM

## 2021-03-07 DIAGNOSIS — O0990 Supervision of high risk pregnancy, unspecified, unspecified trimester: Secondary | ICD-10-CM

## 2021-03-07 DIAGNOSIS — O24410 Gestational diabetes mellitus in pregnancy, diet controlled: Secondary | ICD-10-CM

## 2021-03-07 DIAGNOSIS — O09523 Supervision of elderly multigravida, third trimester: Secondary | ICD-10-CM

## 2021-03-07 NOTE — Progress Notes (Signed)
   PRENATAL VISIT NOTE  Subjective:  Connie Mcgee is a 40 y.o. G4P3 at [redacted]w[redacted]d being seen today for ongoing prenatal care.  She is currently monitored for the following issues for this high-risk pregnancy and has Supervision of high risk pregnancy, antepartum; History of cesarean section; [redacted] weeks gestation of pregnancy; Gestational diabetes mellitus (GDM), antepartum; [redacted] weeks gestation of pregnancy; and Advanced maternal age in multigravida, third trimester on their problem list.  Patient reports no complaints.  Contractions: Not present. Vag. Bleeding: None.  Movement: Present. Denies leaking of fluid.   The following portions of the patient's history were reviewed and updated as appropriate: allergies, current medications, past family history, past medical history, past social history, past surgical history and problem list.   Objective:   Vitals:   03/07/21 1337  BP: 116/75  Pulse: 97  Weight: 184 lb (83.5 kg)    Fetal Status: Fetal Heart Rate (bpm): 140   Movement: Present     General:  Alert, oriented and cooperative. Patient is in no acute distress.  Skin: Skin is warm and dry. No rash noted.   Cardiovascular: Normal heart rate noted  Respiratory: Normal respiratory effort, no problems with respiration noted  Abdomen: Soft, gravid, appropriate for gestational age.  Pain/Pressure: Absent     Pelvic: Cervical exam deferred        Extremities: Normal range of motion.     Mental Status: Normal mood and affect. Normal behavior. Normal judgment and thought content.   Assessment and Plan:  Pregnancy: G4P3 at [redacted]w[redacted]d 1. Supervision of high risk pregnancy, antepartum Has f/u US scheduled  2. Advanced maternal age in multigravida, third trimester BP nl  3. Diet controlled gestational diabetes mellitus (GDM), antepartum FBS  In range as are PP diet control  4. History of cesarean section TOLAC  Preterm labor symptoms and general obstetric precautions including but not  limited to vaginal bleeding, contractions, leaking of fluid and fetal movement were reviewed in detail with the patient. Please refer to After Visit Summary for other counseling recommendations.   Return in about 2 weeks (around 03/21/2021).  Future Appointments  Date Time Provider Department Center  03/21/2021  1:15 PM Constant, Gigi Gin, MD [facility] None  03/29/2021  2:30 PM [facility] NURSE [facility]  03/29/2021  2:45 PM [facility] US5 [facility]    Scheryl Darter, MD

## 2021-03-07 NOTE — Patient Instructions (Signed)

## 2021-03-21 ENCOUNTER — Other Ambulatory Visit: Payer: Self-pay

## 2021-03-21 ENCOUNTER — Encounter: Payer: Self-pay | Admitting: Obstetrics and Gynecology

## 2021-03-21 ENCOUNTER — Ambulatory Visit (INDEPENDENT_AMBULATORY_CARE_PROVIDER_SITE_OTHER): Payer: Medicaid Other | Admitting: Obstetrics and Gynecology

## 2021-03-21 VITALS — BP 116/78 | HR 96 | Wt 187.0 lb

## 2021-03-21 DIAGNOSIS — O24410 Gestational diabetes mellitus in pregnancy, diet controlled: Secondary | ICD-10-CM

## 2021-03-21 DIAGNOSIS — O09523 Supervision of elderly multigravida, third trimester: Secondary | ICD-10-CM

## 2021-03-21 DIAGNOSIS — O0990 Supervision of high risk pregnancy, unspecified, unspecified trimester: Secondary | ICD-10-CM

## 2021-03-21 DIAGNOSIS — Z98891 History of uterine scar from previous surgery: Secondary | ICD-10-CM

## 2021-03-21 NOTE — Progress Notes (Signed)
   PRENATAL VISIT NOTE  Subjective:  Connie Mcgee is a 40 y.o. G4P3 at [redacted]w[redacted]d being seen today for ongoing prenatal care.  She is currently monitored for the following issues for this high-risk pregnancy and has Supervision of high risk pregnancy, antepartum; History of cesarean section; Gestational diabetes mellitus (GDM), antepartum; and Advanced maternal age in multigravida, third trimester on their problem list.  Patient reports no complaints.  Contractions: Not present. Vag. Bleeding: None.  Movement: Present. Denies leaking of fluid.   The following portions of the patient's history were reviewed and updated as appropriate: allergies, current medications, past family history, past medical history, past social history, past surgical history and problem list.   Objective:   Vitals:   03/21/21 1318  BP: 116/78  Pulse: 96  Weight: 187 lb (84.8 kg)    Fetal Status: Fetal Heart Rate (bpm): 140 Fundal Height: 35 cm Movement: Present     General:  Alert, oriented and cooperative. Patient is in no acute distress.  Skin: Skin is warm and dry. No rash noted.   Cardiovascular: Normal heart rate noted  Respiratory: Normal respiratory effort, no problems with respiration noted  Abdomen: Soft, gravid, appropriate for gestational age.  Pain/Pressure: Absent     Pelvic: Cervical exam deferred        Extremities: Normal range of motion.  Edema: Trace  Mental Status: Normal mood and affect. Normal behavior. Normal judgment and thought content.   Assessment and Plan:  Pregnancy: G4P3 at [redacted]w[redacted]d 1. Supervision of high risk pregnancy, antepartum Patient is doing well without complaints Cultures next visit  2. Diet controlled gestational diabetes mellitus (GDM), antepartum Patient did not bring meter or log. She reports fasting as high as 92 and pp within normal range Follow up growth ultrasound 4/20  3. History of cesarean section Patient desires TOLAC and plans Paraguard IUD for  contraception  4. Advanced maternal age in multigravida, third trimester Patient is not taking ASA  Preterm labor symptoms and general obstetric precautions including but not limited to vaginal bleeding, contractions, leaking of fluid and fetal movement were reviewed in detail with the patient. Please refer to After Visit Summary for other counseling recommendations.   Return in about 1 week (around 03/28/2021) for in person, ROB, High risk, cultures.  Future Appointments  Date Time Provider Department Center  03/29/2021  2:30 PM [facility] NURSE [facility]  03/29/2021  2:45 PM [facility] US4 [facility]    Catalina Antigua, MD

## 2021-03-21 NOTE — Progress Notes (Signed)
+   Fetal movement. No complaints. Pt forgot glucose logs but states they are running well.

## 2021-03-28 ENCOUNTER — Other Ambulatory Visit (HOSPITAL_COMMUNITY)
Admission: RE | Admit: 2021-03-28 | Discharge: 2021-03-28 | Disposition: A | Discharge status: Home / Self Care | Payer: Medicaid Other | Source: Ambulatory Visit | Attending: Obstetrics and Gynecology | Admitting: Obstetrics and Gynecology

## 2021-03-28 ENCOUNTER — Ambulatory Visit (INDEPENDENT_AMBULATORY_CARE_PROVIDER_SITE_OTHER): Payer: Medicaid Other | Admitting: Obstetrics and Gynecology

## 2021-03-28 ENCOUNTER — Other Ambulatory Visit: Payer: Self-pay

## 2021-03-28 VITALS — BP 134/82 | HR 103 | Wt 188.0 lb

## 2021-03-28 DIAGNOSIS — O09523 Supervision of elderly multigravida, third trimester: Secondary | ICD-10-CM | POA: Diagnosis not present

## 2021-03-28 DIAGNOSIS — Z8759 Personal history of other complications of pregnancy, childbirth and the puerperium: Secondary | ICD-10-CM | POA: Diagnosis not present

## 2021-03-28 DIAGNOSIS — Z3A36 36 weeks gestation of pregnancy: Secondary | ICD-10-CM

## 2021-03-28 DIAGNOSIS — Z98891 History of uterine scar from previous surgery: Secondary | ICD-10-CM

## 2021-03-28 DIAGNOSIS — O0990 Supervision of high risk pregnancy, unspecified, unspecified trimester: Secondary | ICD-10-CM

## 2021-03-28 DIAGNOSIS — O0993 Supervision of high risk pregnancy, unspecified, third trimester: Secondary | ICD-10-CM | POA: Insufficient documentation

## 2021-03-28 NOTE — Progress Notes (Signed)
    PRENATAL VISIT NOTE  Subjective:  Connie Mcgee is a 40 y.o. G4P3 at [redacted]w[redacted]d being seen today for ongoing prenatal care.  She is currently monitored for the following issues for this high-risk pregnancy and has Supervision of high risk pregnancy, antepartum; History of cesarean section; Gestational diabetes mellitus (GDM), antepartum; and Advanced maternal age in multigravida, third trimester on their problem list.  Patient reports no complaints.  Contractions: Not present. Vag. Bleeding: None.  Movement: Present. Denies leaking of fluid.   The following portions of the patient's history were reviewed and updated as appropriate: allergies, current medications, past family history, past medical history, past social history, past surgical history and problem list.   Objective:   Vitals:   03/28/21 1449  BP: 134/82  Pulse: (!) 103  Weight: 188 lb (85.3 kg)    Fetal Status: Fetal Heart Rate (bpm): 153   Movement: Present     General:  Alert, oriented and cooperative. Patient is in no acute distress.  Skin: Skin is warm and dry. No rash noted.   Cardiovascular: Normal heart rate noted  Respiratory: Normal respiratory effort, no problems with respiration noted  Abdomen: Soft, gravid, appropriate for gestational age.  Pain/Pressure: Present     Pelvic: Cervical exam performed in the presence of a chaperone        Extremities: Normal range of motion.  Edema: None  Mental Status: Normal mood and affect. Normal behavior. Normal judgment and thought content.   Assessment and Plan:  Pregnancy: G4P3 at [redacted]w[redacted]d 1. [redacted] weeks gestation of pregnancy Routine care. miralax or metamucil for constipation - Cervicovaginal ancillary only( [place]) - Strep Gp B NAA  2. Supervision of high risk pregnancy, antepartum  3. History of cesarean section tolac consent already signed 2008 for face presentation. Two prior VDs before that  4. Advanced maternal age in multigravida, third  trimester  5. History of prior pregnancy with IUGR newborn Surveillance growth u/s tomorrow  6. GDMa1 Normal CBG log. Set up IOL nv  Preterm labor symptoms and general obstetric precautions including but not limited to vaginal bleeding, contractions, leaking of fluid and fetal movement were reviewed in detail with the patient. Please refer to After Visit Summary for other counseling recommendations.   Return in about 1 week (around 04/04/2021) for low risk, in person, md or app.  Future Appointments  Date Time Provider Department Center  03/29/2021  2:30 PM [facility] NURSE [facility]  03/29/2021  2:45 PM [facility] US4 [facility]    Craig Bing, MD

## 2021-03-28 NOTE — Progress Notes (Signed)
ROB/GBS.  C/o constipation x 1 day.

## 2021-03-29 ENCOUNTER — Other Ambulatory Visit: Payer: Self-pay | Admitting: Maternal & Fetal Medicine

## 2021-03-29 ENCOUNTER — Ambulatory Visit: Payer: Medicaid Other | Attending: Maternal & Fetal Medicine

## 2021-03-29 ENCOUNTER — Encounter: Payer: Self-pay | Admitting: *Deleted

## 2021-03-29 ENCOUNTER — Ambulatory Visit: Payer: Medicaid Other | Admitting: *Deleted

## 2021-03-29 VITALS — BP 131/76 | HR 102

## 2021-03-29 DIAGNOSIS — Z362 Encounter for other antenatal screening follow-up: Secondary | ICD-10-CM | POA: Diagnosis not present

## 2021-03-29 DIAGNOSIS — O99333 Smoking (tobacco) complicating pregnancy, third trimester: Secondary | ICD-10-CM

## 2021-03-29 DIAGNOSIS — O24410 Gestational diabetes mellitus in pregnancy, diet controlled: Secondary | ICD-10-CM

## 2021-03-29 DIAGNOSIS — O09293 Supervision of pregnancy with other poor reproductive or obstetric history, third trimester: Secondary | ICD-10-CM | POA: Diagnosis not present

## 2021-03-29 DIAGNOSIS — Z720 Tobacco use: Secondary | ICD-10-CM

## 2021-03-29 DIAGNOSIS — O09523 Supervision of elderly multigravida, third trimester: Secondary | ICD-10-CM | POA: Diagnosis present

## 2021-03-29 DIAGNOSIS — O34219 Maternal care for unspecified type scar from previous cesarean delivery: Secondary | ICD-10-CM

## 2021-03-29 DIAGNOSIS — Z3A36 36 weeks gestation of pregnancy: Secondary | ICD-10-CM

## 2021-03-29 DIAGNOSIS — O0933 Supervision of pregnancy with insufficient antenatal care, third trimester: Secondary | ICD-10-CM

## 2021-03-29 LAB — CERVICOVAGINAL ANCILLARY ONLY
Chlamydia: NEGATIVE
Comment: NEGATIVE
Comment: NORMAL
Neisseria Gonorrhea: NEGATIVE

## 2021-03-30 ENCOUNTER — Encounter: Payer: Self-pay | Admitting: Obstetrics and Gynecology

## 2021-03-30 DIAGNOSIS — O99820 Streptococcus B carrier state complicating pregnancy: Secondary | ICD-10-CM | POA: Insufficient documentation

## 2021-03-30 LAB — STREP GP B NAA: Strep Gp B NAA: POSITIVE — AB

## 2021-04-07 ENCOUNTER — Other Ambulatory Visit: Payer: Self-pay

## 2021-04-07 ENCOUNTER — Ambulatory Visit (INDEPENDENT_AMBULATORY_CARE_PROVIDER_SITE_OTHER): Payer: Medicaid Other | Admitting: Medical

## 2021-04-07 ENCOUNTER — Encounter: Payer: Self-pay | Admitting: Medical

## 2021-04-07 VITALS — BP 120/79 | HR 99 | Wt 189.0 lb

## 2021-04-07 DIAGNOSIS — O0990 Supervision of high risk pregnancy, unspecified, unspecified trimester: Secondary | ICD-10-CM

## 2021-04-07 DIAGNOSIS — O24410 Gestational diabetes mellitus in pregnancy, diet controlled: Secondary | ICD-10-CM | POA: Diagnosis not present

## 2021-04-07 DIAGNOSIS — O99820 Streptococcus B carrier state complicating pregnancy: Secondary | ICD-10-CM | POA: Diagnosis not present

## 2021-04-07 DIAGNOSIS — O09523 Supervision of elderly multigravida, third trimester: Secondary | ICD-10-CM

## 2021-04-07 DIAGNOSIS — Z3A37 37 weeks gestation of pregnancy: Secondary | ICD-10-CM

## 2021-04-07 DIAGNOSIS — Z98891 History of uterine scar from previous surgery: Secondary | ICD-10-CM

## 2021-04-07 NOTE — Patient Instructions (Addendum)
Fetal Movement Counts Patient Name: ________________________________________________ Patient Due Date: ____________________  What is a fetal movement count? A fetal movement count is the number of times that you feel your baby move during a certain amount of time. This may also be called a fetal kick count. A fetal movement count is recommended for every pregnant woman. You may be asked to start counting fetal movements as early as week 28 of your pregnancy. Pay attention to when your baby is most active. You may notice your baby's sleep and wake cycles. You may also notice things that make your baby move more. You should do a fetal movement count:  When your baby is normally most active.  At the same time each day. A good time to count movements is while you are resting, after having something to eat and drink. How do I count fetal movements? 1. Find a quiet, comfortable area. Sit, or lie down on your side. 2. Write down the date, the start time and stop time, and the number of movements that you felt between those two times. Take this information with you to your health care visits. 3. Write down your start time when you feel the first movement. 4. Count kicks, flutters, swishes, rolls, and jabs. You should feel at least 10 movements. 5. You may stop counting after you have felt 10 movements, or if you have been counting for 2 hours. Write down the stop time. 6. If you do not feel 10 movements in 2 hours, contact your health care provider for further instructions. Your health care provider may want to do additional tests to assess your baby's well-being. Contact a health care provider if:  You feel fewer than 10 movements in 2 hours.  Your baby is not moving like he or she usually does. Date: ____________ Start time: ____________ Stop time: ____________ Movements: ____________ Date: ____________ Start time: ____________ Stop time: ____________ Movements: ____________ Date: ____________  Start time: ____________ Stop time: ____________ Movements: ____________ Date: ____________ Start time: ____________ Stop time: ____________ Movements: ____________ Date: ____________ Start time: ____________ Stop time: ____________ Movements: ____________ Date: ____________ Start time: ____________ Stop time: ____________ Movements: ____________ Date: ____________ Start time: ____________ Stop time: ____________ Movements: ____________ Date: ____________ Start time: ____________ Stop time: ____________ Movements: ____________ Date: ____________ Start time: ____________ Stop time: ____________ Movements: ____________ This information is not intended to replace advice given to you by your health care provider. Make sure you discuss any questions you have with your health care provider. Document Revised: 07/16/2019 Document Reviewed: 07/16/2019 Elsevier Patient Education  2021 Elsevier Inc. Rosen's Emergency Medicine: Concepts and Clinical Practice (9th ed., pp. 2296- 2312). Elsevier.">  Braxton Hicks Contractions Contractions of the uterus can occur throughout pregnancy, but they are not always a sign that you are in labor. You may have practice contractions called Braxton Hicks contractions. These false labor contractions are sometimes confused with true labor. What are Braxton Hicks contractions? Braxton Hicks contractions are tightening movements that occur in the muscles of the uterus before labor. Unlike true labor contractions, these contractions do not result in opening (dilation) and thinning of the cervix. Toward the end of pregnancy (32-34 weeks), Braxton Hicks contractions can happen more often and may become stronger. These contractions are sometimes difficult to tell apart from true labor because they can be very uncomfortable. You should not feel embarrassed if you go to the hospital with false labor. Sometimes, the only way to tell if you are in true labor is for your   health care  provider to look for changes in the cervix. The health care provider will do a physical exam and may monitor your contractions. If you are not in true labor, the exam should show that your cervix is not dilating and your water has not broken. If there are no other health problems associated with your pregnancy, it is completely safe for you to be sent home with false labor. You may continue to have Braxton Hicks contractions until you go into true labor. How to tell the difference between true labor and false labor True labor  Contractions last 30-70 seconds.  Contractions become very regular.  Discomfort is usually felt in the top of the uterus, and it spreads to the lower abdomen and low back.  Contractions do not go away with walking.  Contractions usually become more intense and increase in frequency.  The cervix dilates and gets thinner. False labor  Contractions are usually shorter and not as strong as true labor contractions.  Contractions are usually irregular.  Contractions are often felt in the front of the lower abdomen and in the groin.  Contractions may go away when you walk around or change positions while lying down.  Contractions get weaker and are shorter-lasting as time goes on.  The cervix usually does not dilate or become thin. Follow these instructions at home:  Take over-the-counter and prescription medicines only as told by your health care provider.  Keep up with your usual exercises and follow other instructions from your health care provider.  Eat and drink lightly if you think you are going into labor.  If Braxton Hicks contractions are making you uncomfortable: ? Change your position from lying down or resting to walking, or change from walking to resting. ? Sit and rest in a tub of warm water. ? Drink enough fluid to keep your urine pale yellow. Dehydration may cause these contractions. ? Do slow and deep breathing several times an hour.  Keep  all follow-up prenatal visits as told by your health care provider. This is important.   Contact a health care provider if:  You have a fever.  You have continuous pain in your abdomen. Get help right away if:  Your contractions become stronger, more regular, and closer together.  You have fluid leaking or gushing from your vagina.  You pass blood-tinged mucus (bloody show).  You have bleeding from your vagina.  You have low back pain that you never had before.  You feel your baby's head pushing down and causing pelvic pressure.  Your baby is not moving inside you as much as it used to. Summary  Contractions that occur before labor are called Braxton Hicks contractions, false labor, or practice contractions.  Braxton Hicks contractions are usually shorter, weaker, farther apart, and less regular than true labor contractions. True labor contractions usually become progressively stronger and regular, and they become more frequent.  Manage discomfort from [facility] contractions by changing position, resting in a warm bath, drinking plenty of water, or practicing deep breathing. This information is not intended to replace advice given to you by your health care provider. Make sure you discuss any questions you have with your health care provider. Document Revised: 11/08/2017 Document Reviewed: 04/11/2017 Elsevier Patient Education  2021 [employer].     Dental Resources   [place]   Dr. Remer Macho  Exam $85   [address]  Extraction $120 and up   [employer], [place]  *full list  of prices available*   [phone]      [facility]  Exam [phone]   [address]  Exam w/ Xrays $380   [place], [place]  Xrays $44 and up   [phone]  Cleaning $101   Extraction $190 and up      [facility]  Cleaning + Xray $344   [address]  Extraction- pt has to be seen first to give price   [place], [place]   [phone]     [facility]   Dr.  Romeo Apple Turner/Dr. Richrd Humbles  Exam, Cleaning, Xray $262   [address]  Extraction [phone]   [place]   [phone]      [facility]  Cleaning $5   [address] $5   [place], [place] [address]  Call to get on waiting list   [phone] ext [phone]     Dr. Fayrene Fearing McMasters/Dr. Stephenie Acres   [address]  Xray $85 Each   [place], [place] [address]  Extraction [phone]    Dr. Hoover Browns  Extraction $300 per tooth   [address]   [place], [place] [address]   [phone]      Dr. Nuala Alpha  Cleaning $300   [address]  Extraction $273   [place], [place] [address]   [phone]     [facility]  Emergency Exam $65   [address]  Cleaning & Exam $150   [place], [place] [address]  Extractions: Simple $180 Surgical $250   [phone]  [place] [phone]

## 2021-04-07 NOTE — Progress Notes (Signed)
   PRENATAL VISIT NOTE  Subjective:  Connie Mcgee is a 40 y.o. G4P3 at [redacted]w[redacted]d being seen today for ongoing prenatal care.  She is currently monitored for the following issues for this high-risk pregnancy and has Supervision of high risk pregnancy, antepartum; History of cesarean section; Gestational diabetes mellitus (GDM), antepartum; Advanced maternal age in multigravida, third trimester; and GBS (group B Streptococcus carrier), +RV culture, currently pregnant on their problem list.  Patient reports no complaints.  Contractions: Not present. Vag. Bleeding: None.  Movement: Present. Denies leaking of fluid.   The following portions of the patient's history were reviewed and updated as appropriate: allergies, current medications, past family history, past medical history, past social history, past surgical history and problem list.   Objective:   Vitals:   04/07/21 1117  BP: 120/79  Pulse: 99  Weight: 189 lb (85.7 kg)    Fetal Status: Fetal Heart Rate (bpm): 144   Movement: Present     General:  Alert, oriented and cooperative. Patient is in no acute distress.  Skin: Skin is warm and dry. No rash noted.   Cardiovascular: Normal heart rate noted  Respiratory: Normal respiratory effort, no problems with respiration noted  Abdomen: Soft, gravid, appropriate for gestational age.  Pain/Pressure: Absent     Pelvic: Cervical exam deferred        Extremities: Normal range of motion.  Edema: Trace  Mental Status: Normal mood and affect. Normal behavior. Normal judgment and thought content.   Assessment and Plan:  Pregnancy: G4P3 at [redacted]w[redacted]d 1. Supervision of high risk pregnancy, antepartum - Doing well   2. GBS (group B Streptococcus carrier), +RV culture, currently pregnant - Treat in labor, discussed with patient   3. Advanced maternal age in multigravida, third trimester - < 40, no change in management   4. History of cesarean section - TOLAC planned and consent signed  previously   5. Diet controlled gestational diabetes mellitus (GDM), antepartum - Did not bring log, states she has eliminated fruit and starch from diet - States fasting values < 93 and PP 93-99 - Growth [place] on 03/29/21 normal with EFW 31% - IOL scheduled for 04/18/21, orders placed   6. [redacted] weeks gestation of pregnancy  Term labor symptoms and general obstetric precautions including but not limited to vaginal bleeding, contractions, leaking of fluid and fetal movement were reviewed in detail with the patient. Please refer to After Visit Summary for other counseling recommendations.   Return in about 1 week (around 04/14/2021) for [facility] APP, In-Person.  Future Appointments  Date Time Provider Department Center  04/18/2021 10:30 AM Warden Fillers, MD [facility] None    Vonzella Nipple, PA-C

## 2021-04-07 NOTE — Progress Notes (Signed)
ROB [redacted]w[redacted]d  Pt did not bring blood sugar log today. Pt states sugars have been fine.  GBS + on 03/28/21  Declines cervix check today.

## 2021-04-10 ENCOUNTER — Other Ambulatory Visit: Payer: Self-pay | Admitting: Advanced Practice Midwife

## 2021-04-12 ENCOUNTER — Telehealth (HOSPITAL_COMMUNITY): Payer: Self-pay | Admitting: *Deleted

## 2021-04-12 NOTE — Telephone Encounter (Signed)
Preadmission screen  

## 2021-04-13 ENCOUNTER — Telehealth (HOSPITAL_COMMUNITY): Payer: Self-pay | Admitting: *Deleted

## 2021-04-13 NOTE — Telephone Encounter (Signed)
Preadmission screen  

## 2021-04-17 ENCOUNTER — Other Ambulatory Visit (HOSPITAL_COMMUNITY): Payer: Medicaid Other | Attending: Family Medicine

## 2021-04-18 ENCOUNTER — Inpatient Hospital Stay (HOSPITAL_COMMUNITY)
Admission: AD | Admit: 2021-04-18 | Discharge: 2021-04-20 | DRG: 807 | Disposition: A | Discharge status: Home / Self Care | Payer: Medicaid Other | Attending: Obstetrics & Gynecology | Admitting: Obstetrics & Gynecology

## 2021-04-18 ENCOUNTER — Inpatient Hospital Stay (HOSPITAL_COMMUNITY): Payer: Medicaid Other

## 2021-04-18 ENCOUNTER — Encounter: Payer: Medicaid Other | Admitting: Obstetrics and Gynecology

## 2021-04-18 ENCOUNTER — Other Ambulatory Visit: Payer: Self-pay

## 2021-04-18 ENCOUNTER — Inpatient Hospital Stay (HOSPITAL_COMMUNITY): Payer: Medicaid Other | Admitting: Anesthesiology

## 2021-04-18 ENCOUNTER — Encounter (HOSPITAL_COMMUNITY): Payer: Self-pay | Admitting: Family Medicine

## 2021-04-18 DIAGNOSIS — O99355 Diseases of the nervous system complicating the puerperium: Secondary | ICD-10-CM | POA: Diagnosis not present

## 2021-04-18 DIAGNOSIS — Z98891 History of uterine scar from previous surgery: Secondary | ICD-10-CM

## 2021-04-18 DIAGNOSIS — Z3A39 39 weeks gestation of pregnancy: Secondary | ICD-10-CM | POA: Diagnosis not present

## 2021-04-18 DIAGNOSIS — O24420 Gestational diabetes mellitus in childbirth, diet controlled: Principal | ICD-10-CM | POA: Diagnosis present

## 2021-04-18 DIAGNOSIS — Z30430 Encounter for insertion of intrauterine contraceptive device: Secondary | ICD-10-CM | POA: Diagnosis not present

## 2021-04-18 DIAGNOSIS — F17210 Nicotine dependence, cigarettes, uncomplicated: Secondary | ICD-10-CM | POA: Diagnosis present

## 2021-04-18 DIAGNOSIS — O34211 Maternal care for low transverse scar from previous cesarean delivery: Secondary | ICD-10-CM | POA: Diagnosis not present

## 2021-04-18 DIAGNOSIS — G8918 Other acute postprocedural pain: Secondary | ICD-10-CM | POA: Diagnosis not present

## 2021-04-18 DIAGNOSIS — O99820 Streptococcus B carrier state complicating pregnancy: Secondary | ICD-10-CM

## 2021-04-18 DIAGNOSIS — O34219 Maternal care for unspecified type scar from previous cesarean delivery: Secondary | ICD-10-CM | POA: Diagnosis present

## 2021-04-18 DIAGNOSIS — O99824 Streptococcus B carrier state complicating childbirth: Secondary | ICD-10-CM | POA: Diagnosis present

## 2021-04-18 DIAGNOSIS — Z20822 Contact with and (suspected) exposure to covid-19: Secondary | ICD-10-CM | POA: Diagnosis present

## 2021-04-18 DIAGNOSIS — O99334 Smoking (tobacco) complicating childbirth: Secondary | ICD-10-CM | POA: Diagnosis present

## 2021-04-18 DIAGNOSIS — O09523 Supervision of elderly multigravida, third trimester: Secondary | ICD-10-CM | POA: Diagnosis present

## 2021-04-18 DIAGNOSIS — O24419 Gestational diabetes mellitus in pregnancy, unspecified control: Secondary | ICD-10-CM | POA: Diagnosis present

## 2021-04-18 LAB — GLUCOSE, CAPILLARY
Glucose-Capillary: 87 mg/dL (ref 70–99)
Glucose-Capillary: 75 mg/dL (ref 70–99)
Glucose-Capillary: 58 mg/dL — ABNORMAL LOW (ref 70–99)

## 2021-04-18 LAB — RESP PANEL BY RT-PCR (FLU A&B, COVID) ARPGX2
Influenza A by PCR: NEGATIVE
Influenza B by PCR: NEGATIVE
SARS Coronavirus 2 by RT PCR: NEGATIVE

## 2021-04-18 LAB — CBC
HCT: 34.8 % — ABNORMAL LOW (ref 36.0–46.0)
Hemoglobin: 11.4 g/dL — ABNORMAL LOW (ref 12.0–15.0)
MCH: 30.6 pg (ref 26.0–34.0)
MCHC: 32.8 g/dL (ref 30.0–36.0)
MCV: 93.3 fL (ref 80.0–100.0)
Platelets: 222 10*3/uL (ref 150–400)
RBC: 3.73 MIL/uL — ABNORMAL LOW (ref 3.87–5.11)
RDW: 14.4 % (ref 11.5–15.5)
WBC: 7.1 10*3/uL (ref 4.0–10.5)
nRBC: 0.3 % — ABNORMAL HIGH (ref 0.0–0.2)

## 2021-04-18 LAB — TYPE AND SCREEN
ABO/RH(D): O POS
Antibody Screen: NEGATIVE

## 2021-04-18 LAB — RPR: RPR Ser Ql: NONREACTIVE

## 2021-04-18 MED ORDER — LIDOCAINE HCL (PF) 1 % IJ SOLN: 30.0000 mL | INTRAMUSCULAR | Status: DC | PRN

## 2021-04-18 MED ORDER — DIPHENHYDRAMINE HCL 50 MG/ML IJ SOLN: 12.5000 mg | INTRAMUSCULAR | Status: DC | PRN

## 2021-04-18 MED ORDER — ONDANSETRON HCL 4 MG/2ML IJ SOLN: 4.0000 mg | Freq: Four times a day (QID) | INTRAMUSCULAR | Status: DC | PRN

## 2021-04-18 MED ORDER — OXYTOCIN-SODIUM CHLORIDE 30-0.9 UT/500ML-% IV SOLN
1.0000 m[IU]/min | INTRAVENOUS | Status: DC
  Administered 2021-04-18: 2 m[IU]/min via INTRAVENOUS
  Filled 2021-04-18: qty 500

## 2021-04-18 MED ORDER — LACTATED RINGERS IV SOLN
500.0000 mL | INTRAVENOUS | Status: DC | PRN
  Administered 2021-04-19: 500 mL via INTRAVENOUS

## 2021-04-18 MED ORDER — EPHEDRINE 5 MG/ML INJ: 10.0000 mg | INTRAVENOUS | Status: DC | PRN

## 2021-04-18 MED ORDER — SOD CITRATE-CITRIC ACID 500-334 MG/5ML PO SOLN: 30.0000 mL | ORAL | Status: DC | PRN

## 2021-04-18 MED ORDER — FENTANYL-BUPIVACAINE-NACL 0.5-0.125-0.9 MG/250ML-% EP SOLN
12.0000 mL/h | EPIDURAL | Status: DC | PRN
  Administered 2021-04-18 – 2021-04-19 (×2): 12 mL/h via EPIDURAL
  Filled 2021-04-18 (×2): qty 250

## 2021-04-18 MED ORDER — LIDOCAINE HCL (PF) 1 % IJ SOLN
INTRAMUSCULAR | Status: DC | PRN
Start: 2021-04-18 — End: 2021-04-19
  Administered 2021-04-18 (×2): 5 mL via EPIDURAL

## 2021-04-18 MED ORDER — OXYCODONE-ACETAMINOPHEN 5-325 MG PO TABS: 1.0000 | ORAL_TABLET | ORAL | Status: DC | PRN

## 2021-04-18 MED ORDER — SODIUM CHLORIDE 0.9 % IV SOLN
5.0000 10*6.[IU] | Freq: Once | INTRAVENOUS | Status: AC
  Administered 2021-04-18: 5 10*6.[IU] via INTRAVENOUS
  Filled 2021-04-18: qty 5

## 2021-04-18 MED ORDER — LACTATED RINGERS IV SOLN: INTRAVENOUS | Status: DC

## 2021-04-18 MED ORDER — TERBUTALINE SULFATE 1 MG/ML IJ SOLN: 0.2500 mg | Freq: Once | INTRAMUSCULAR | Status: DC | PRN

## 2021-04-18 MED ORDER — PHENYLEPHRINE 40 MCG/ML (10ML) SYRINGE FOR IV PUSH (FOR BLOOD PRESSURE SUPPORT): 80.0000 ug | PREFILLED_SYRINGE | INTRAVENOUS | Status: DC | PRN

## 2021-04-18 MED ORDER — OXYCODONE-ACETAMINOPHEN 5-325 MG PO TABS: 2.0000 | ORAL_TABLET | ORAL | Status: DC | PRN

## 2021-04-18 MED ORDER — ACETAMINOPHEN 325 MG PO TABS: 650.0000 mg | ORAL_TABLET | ORAL | Status: DC | PRN

## 2021-04-18 MED ORDER — OXYTOCIN BOLUS FROM INFUSION
333.0000 mL | Freq: Once | INTRAVENOUS | Status: AC
  Administered 2021-04-19: 333 mL via INTRAVENOUS

## 2021-04-18 MED ORDER — PENICILLIN G POT IN DEXTROSE 60000 UNIT/ML IV SOLN
3.0000 10*6.[IU] | INTRAVENOUS | Status: DC
  Administered 2021-04-18 – 2021-04-19 (×4): 3 10*6.[IU] via INTRAVENOUS
  Filled 2021-04-18 (×5): qty 50

## 2021-04-18 MED ORDER — OXYTOCIN-SODIUM CHLORIDE 30-0.9 UT/500ML-% IV SOLN
2.5000 [IU]/h | INTRAVENOUS | Status: DC
  Administered 2021-04-19: 2.5 [IU]/h via INTRAVENOUS
  Filled 2021-04-18: qty 500

## 2021-04-18 MED ORDER — LACTATED RINGERS IV SOLN: 500.0000 mL | Freq: Once | INTRAVENOUS | Status: DC

## 2021-04-18 NOTE — Anesthesia Preprocedure Evaluation (Addendum)
Anesthesia Evaluation  Patient identified by MRN, date of birth, ID band Patient awake    Reviewed: Allergy & Precautions, NPO status , Patient's Chart, lab work & pertinent test results  Airway Mallampati: II  TM Distance: >3 FB Neck ROM: Full    Dental no notable dental hx.    Pulmonary neg pulmonary ROS, Current Smoker,    Pulmonary exam normal breath sounds clear to auscultation       Cardiovascular negative cardio ROS Normal cardiovascular exam Rhythm:Regular Rate:Normal     Neuro/Psych negative neurological ROS  negative psych ROS   GI/Hepatic negative GI ROS, Neg liver ROS,   Endo/Other  diabetes, Well Controlled, Gestational  Renal/GU negative Renal ROS  negative genitourinary   Musculoskeletal negative musculoskeletal ROS (+)   Abdominal   Peds negative pediatric ROS (+)  Hematology negative hematology ROS (+) anemia ,   Anesthesia Other Findings   Reproductive/Obstetrics (+) Pregnancy                            Anesthesia Physical Anesthesia Plan  ASA: II  Anesthesia Plan: Epidural   Post-op Pain Management:    Induction:   PONV Risk Score and Plan: 1  Airway Management Planned: Natural Airway  Additional Equipment:   Intra-op Plan:   Post-operative Plan:   Informed Consent: I have reviewed the patients History and Physical, chart, labs and discussed the procedure including the risks, benefits and alternatives for the proposed anesthesia with the patient or authorized representative who has indicated his/her understanding and acceptance.       Plan Discussed with: Anesthesiologist  Anesthesia Plan Comments:         Anesthesia Quick Evaluation

## 2021-04-18 NOTE — H&P (Addendum)
OBSTETRIC ADMISSION HISTORY AND PHYSICAL  Connie Mcgee is a 40 y.o. female G4P3 with IUP at 55w0dby LMP confirmed on 16 wk [place]resenting for [place]ofor [name]. She reports +FMs, No LOF, no VB, no blurry vision, headaches or peripheral edema, and RUQ pain.  She plans on bottle feeding. She request PP IUD for birth control. [facility] LMP confirmed on 16 wk [place]--->  Estimated Date of Delivery: 04/25/21  Sono:    '@[redacted]w[redacted]d' , CWD, normal anatomy, cephalic presentation, anterior lie, 2668g, 31% EFW   Prenatal History/Complications: AI9SWN AMA, Hx of Anemia, Hx of C-Section for face presentation  Past Medical History: Past Medical History:  Diagnosis Date  . Anemia     Past Surgical History: Past Surgical History:  Procedure Laterality Date  . CESAREAN SECTION      Obstetrical History: OB History    Gravida  4   Para  3   Term      Preterm      AB      Living  3     SAB      IAB      Ectopic      Multiple      Live Births  3           Social History Social History   Socioeconomic History  . Marital status: Single    Spouse name: Not on file  . Number of children: Not on file  . Years of education: Not on file  . Highest education level: Not on file  Occupational History  . Not on file  Tobacco Use  . Smoking status: Current Every Day Smoker  . Smokeless tobacco: Never Used  . Tobacco comment: 2-3 cigs/day  Vaping Use  . Vaping Use: Never used  Substance and Sexual Activity  . Alcohol use: Not Currently  . Drug use: Not Currently  . Sexual activity: Not on file  Other Topics Concern  . Not on file  Social History Narrative  . Not on file   Social Determinants of Health   Financial Resource Strain: Not on file  Food Insecurity: Not on file  Transportation Needs: Not on file  Physical Activity: Not on file  Stress: Not on file  Social Connections: Not on file    Family History: Family  History  Problem Relation Age of Onset  . Diabetes Mother     Allergies: No Known Allergies  Medications Prior to Admission  Medication Sig Dispense Refill Last Dose  . Prenatal Vit-Fe Fumarate-FA (MULTIVITAMIN-PRENATAL) 27-0.8 MG TABS tablet Take 1 tablet by mouth daily at 12 noon.   04/17/2021 at Unknown time  . Accu-Chek Softclix Lancets lancets Please check blood sugars up to four times per day. 100 each 2   . Blood Glucose Monitoring Suppl (ACCU-CHEK GUIDE) w/Device KIT 1 Device by Does not apply route 4 (four) times daily. 1 kit 0   . glucose blood (ACCU-CHEK GUIDE) test strip Use to check blood sugars four times a day was instructed 50 each 12      Review of Systems   All systems reviewed and negative except as stated in HPI  Blood pressure 122/78, pulse (!) 106, temperature 98.9 F (37.2 C), temperature source Oral, resp. rate 18, height '5\' 4"'  (1.626 m), weight 87.2 kg, last menstrual period 07/19/2020. General appearance: alert and cooperative Lungs: breathing comfortably Heart: regular rate and rhythm Abdomen: soft, non-tender; bowel sounds  normal Extremities: Homans sign is negative, no sign of DVT Presentation: cephalic Fetal monitoringBaseline: 135 bpm/[place]/ - Accels, - Deccels Uterine activityNone Dilation: 1.5 Effacement (%): 50 Exam by:: Dr. Vanetta Shawl   Prenatal labs: ABO, Rh: O+ Antibody: PENDING (05/10 0940) Rubella:  Positive RPR: Non Reactive (02/08 1007)  HBsAg:   Non-Reactive HIV: Non Reactive (02/08 1007)  GBS: Positive/-- (04/19 0302)  2 hr Glucola: Positive Genetic screening Not done  Anatomy US Normal  Prenatal Transfer Tool  Maternal Diabetes: Yes:  Diabetes Type:  Diet controlled Genetic Screening: Declined Maternal Ultrasounds/Referrals: Normal Fetal Ultrasounds or other Referrals:  None Maternal Substance Abuse:  No Significant Maternal Medications:  None Significant Maternal Lab Results: Group B Strep positive  Results for orders  placed or performed during the hospital encounter of 04/18/21 (from the past 24 hour(s))  CBC   Collection Time: 04/18/21  9:23 AM  Result Value Ref Range   WBC 7.1 4.0 - 10.5 K/uL   RBC 3.73 (L) 3.87 - 5.11 MIL/uL   Hemoglobin 11.4 (L) 12.0 - 15.0 g/dL   HCT 34.8 (L) 36.0 - 46.0 %   MCV 93.3 80.0 - 100.0 fL   MCH 30.6 26.0 - 34.0 pg   MCHC 32.8 30.0 - 36.0 g/dL   RDW 14.4 11.5 - 15.5 %   Platelets 222 150 - 400 K/uL   nRBC 0.3 (H) 0.0 - 0.2 %  Glucose, capillary   Collection Time: 04/18/21  9:39 AM  Result Value Ref Range   Glucose-Capillary 87 70 - 99 mg/dL  Type and screen   Collection Time: 04/18/21  9:40 AM  Result Value Ref Range   ABO/RH(D) PENDING    Antibody Screen PENDING    Sample Expiration      04/21/2021,2359 Performed at [facility], [address], [place], [place] [address]     Patient Active Problem List   Diagnosis Date Noted  . Gestational diabetes mellitus (GDM) in third trimester 04/18/2021  . GBS (group B Streptococcus carrier), +RV culture, currently pregnant 03/30/2021  . Advanced maternal age in multigravida, third trimester 02/21/2021  . Gestational diabetes mellitus (GDM), antepartum 02/01/2021  . Supervision of high risk pregnancy, antepartum 12/20/2020  . History of cesarean section 12/20/2020    Assessment/Plan:  Connie Mcgee is a 40 y.o. G4P3 at 41w0dhere for TBlue Grassfor GDM.   #Induction:  Foley balloon placed with 60cc saline.  Start Pitocin, low dose while FB in place.   #Pain: PRN #FWB:  Cat 1 #ID: GBS+, Penicillin ordered #MOF: Bottle #MOC: PP IUD #Circ: Yes #[name]:  CBG monitoring.   #Hx of Anemia:  Admitting Hb 11.4.  PDelora Fuel MD  04/18/2021, 10:29 AM   Attestation of Attending Supervision of Resident: Evaluation and management procedures were performed by the [facility] under my supervision. I was immediately available for direct supervision, assistance and direction throughout this  encounter.  I also confirm that I have verified the information documented in the resident's note, and that I have also personally reperformed the pertinent components of the physical exam and all of the medical decision making activities.   I personally placed FB with 60cc saline, inserted with ease, patient tolerated procedure well.   EKatherine Basset DSingacfor [employer]De[employer] CDobbinsGroup 04/18/2021  1:26 PM

## 2021-04-18 NOTE — Progress Notes (Signed)
Labor Progress Note Connie Mcgee is a 40 y.o. G4P3 at [redacted]w[redacted]d presented for TOLAC IOL for [name]. S: Patient now feeling significant contractions and discomfort.  O:  BP 131/69   Pulse 80   Temp 98.7 F (37.1 C) (Oral)   Resp 16   Ht 5\' 4"  (1.626 m)   Wt 87.2 kg   LMP 07/19/2020   BMI 32.99 kg/m  EFM: 150/Moderate/ + Accels, +Variable Deccels  CVE: Dilation: 1.5 Effacement (%): 50 Presentation: Vertex Exam by:: Dr. [email or web address]   A&P: 40 y.o. G4P3 105w0d presented for TOLAC IOL for [name]. #Labor: Progressing well. Foley bulb out at 1447.  Continue Pitocin at current dose given current contractions.  Patient requesting Epidural placement prior to AROM. #Pain: PRN, requesting Epidural #FWB: Cat 2 #GBS positive, adequate treatment with Penicillin #[name]:  Initial Glucose 89.  Continue CBG monitoring.     [redacted]w[redacted]d, MD 2:56 PM

## 2021-04-18 NOTE — Anesthesia Procedure Notes (Signed)
Epidural Patient location during procedure: OB Start time: 04/18/2021 3:42 PM End time: 04/18/2021 3:52 PM  Staffing Anesthesiologist: Mellody Dance, MD Performed: anesthesiologist   Preanesthetic Checklist Completed: patient identified, IV checked, site marked, risks and benefits discussed, monitors and equipment checked, pre-op evaluation and timeout performed  Epidural Patient position: sitting Prep: DuraPrep Patient monitoring: heart rate, cardiac monitor, continuous pulse ox and blood pressure Approach: midline Location: L2-L3 Injection technique: LOR saline  Needle:  Needle type: Tuohy  Needle gauge: 17 G Needle length: 9 cm Needle insertion depth: 6 cm Catheter type: closed end flexible Catheter size: 20 Guage Catheter at skin depth: 10 cm Test dose: negative and Other  Assessment Events: blood not aspirated, injection not painful, no injection resistance and negative IV test  Additional Notes Informed consent obtained prior to proceeding including risk of failure, 1% risk of PDPH, risk of minor discomfort and bruising.  Discussed rare but serious complications including epidural abscess, permanent nerve injury, epidural hematoma.  Discussed alternatives to epidural analgesia and patient desires to proceed.  Timeout performed pre-procedure verifying patient name, procedure, and platelet count.  Patient tolerated procedure well.

## 2021-04-18 NOTE — Progress Notes (Signed)
Labor Progress Note Connie Mcgee is a 40 y.o. G4P3 at [redacted]w[redacted]d presented for IOL secondary to [name], desire to [facility].  S: Pt comfortable with epidural in place. No concerns at this time.  O:  BP (!) 117/56   Pulse 87   Temp 98.7 F (37.1 C) (Oral)   Resp 16   Ht 5\' 4"  (1.626 m)   Wt 87.2 kg   LMP 07/19/2020   BMI 32.99 kg/m  EFM: baseline 130/Moderate variability/+accels/single variable decel Toco: ctx every 4-5 min  CVE: Dilation: 5.5 Effacement (%): 70 Station: -2 Presentation: Vertex Exam by:: Dr. [email or web address]   A&P: 40 y.o. G4P3 [redacted]w[redacted]d presented for TOLAC IOL for [name]. #Labor: S/p FB (out at 1447). Pitocin continued since 1045. Now s/p AROM for large volume clear fluid at 2055. Of note, direct LOT on bedside ultrasound prior to AROM. IUPC placed to facilitate titration of pitocin. Will plan to recheck cervical exam in 4 hours or sooner as clinically indicated. #Pain: epidural in place #FWB: Cat 2 strip given single variable decel s/p AROM; reassuringly, moderate variability with multiple accels. #GBS positive, adequate treatment with Penicillin #[name]:  low normal BG levels on admission. Will continue to monitor.  2056, MD 9:03 PM

## 2021-04-19 DIAGNOSIS — O24420 Gestational diabetes mellitus in childbirth, diet controlled: Secondary | ICD-10-CM

## 2021-04-19 DIAGNOSIS — O99824 Streptococcus B carrier state complicating childbirth: Secondary | ICD-10-CM

## 2021-04-19 DIAGNOSIS — O34219 Maternal care for unspecified type scar from previous cesarean delivery: Secondary | ICD-10-CM

## 2021-04-19 DIAGNOSIS — O34211 Maternal care for low transverse scar from previous cesarean delivery: Secondary | ICD-10-CM

## 2021-04-19 DIAGNOSIS — Z3A39 39 weeks gestation of pregnancy: Secondary | ICD-10-CM

## 2021-04-19 DIAGNOSIS — Z30430 Encounter for insertion of intrauterine contraceptive device: Secondary | ICD-10-CM

## 2021-04-19 LAB — BIRTH TISSUE RECOVERY COLLECTION (PLACENTA DONATION)

## 2021-04-19 MED ORDER — DIPHENHYDRAMINE HCL 25 MG PO CAPS: 25.0000 mg | ORAL_CAPSULE | Freq: Four times a day (QID) | ORAL | Status: DC | PRN

## 2021-04-19 MED ORDER — FENTANYL CITRATE (PF) 100 MCG/2ML IJ SOLN
INTRAMUSCULAR | Status: AC
  Filled 2021-04-19: qty 2

## 2021-04-19 MED ORDER — PHENYLEPHRINE 40 MCG/ML (10ML) SYRINGE FOR IV PUSH (FOR BLOOD PRESSURE SUPPORT)
PREFILLED_SYRINGE | INTRAVENOUS | Status: AC
  Filled 2021-04-19: qty 10

## 2021-04-19 MED ORDER — IBUPROFEN 600 MG PO TABS
600.0000 mg | ORAL_TABLET | Freq: Four times a day (QID) | ORAL | Status: DC
  Administered 2021-04-19 – 2021-04-20 (×5): 600 mg via ORAL
  Filled 2021-04-19 (×5): qty 1

## 2021-04-19 MED ORDER — SENNOSIDES-DOCUSATE SODIUM 8.6-50 MG PO TABS
2.0000 | ORAL_TABLET | Freq: Every day | ORAL | Status: DC
  Administered 2021-04-20: 2 via ORAL
  Filled 2021-04-19: qty 2

## 2021-04-19 MED ORDER — TETANUS-DIPHTH-ACELL PERTUSSIS 5-2.5-18.5 LF-MCG/0.5 IM SUSY: 0.5000 mL | PREFILLED_SYRINGE | Freq: Once | INTRAMUSCULAR | Status: DC

## 2021-04-19 MED ORDER — BENZOCAINE-MENTHOL 20-0.5 % EX AERO
1.0000 | INHALATION_SPRAY | CUTANEOUS | Status: DC | PRN
Start: 2021-04-19 — End: 2021-04-20
  Filled 2021-04-19: qty 56

## 2021-04-19 MED ORDER — LIDOCAINE HCL (PF) 2 % IJ SOLN
INTRAMUSCULAR | Status: AC
  Filled 2021-04-19: qty 20

## 2021-04-19 MED ORDER — COCONUT OIL OIL: 1.0000 "application " | TOPICAL_OIL | Status: DC | PRN

## 2021-04-19 MED ORDER — ACETAMINOPHEN 325 MG PO TABS
650.0000 mg | ORAL_TABLET | Freq: Four times a day (QID) | ORAL | Status: DC
  Administered 2021-04-19 – 2021-04-20 (×5): 650 mg via ORAL
  Filled 2021-04-19 (×5): qty 2

## 2021-04-19 MED ORDER — SIMETHICONE 80 MG PO CHEW: 80.0000 mg | CHEWABLE_TABLET | ORAL | Status: DC | PRN

## 2021-04-19 MED ORDER — EPINEPHRINE PF 1 MG/ML IJ SOLN
INTRAMUSCULAR | Status: AC
  Filled 2021-04-19: qty 1

## 2021-04-19 MED ORDER — DIBUCAINE (PERIANAL) 1 % EX OINT
1.0000 | TOPICAL_OINTMENT | CUTANEOUS | Status: DC | PRN
Start: 2021-04-19 — End: 2021-04-20

## 2021-04-19 MED ORDER — LEVONORGESTREL 20.1 MCG/DAY IU IUD
INTRAUTERINE_SYSTEM | INTRAUTERINE | Status: AC
  Administered 2021-04-19: 1 via INTRAUTERINE
  Filled 2021-04-19: qty 1

## 2021-04-19 MED ORDER — ONDANSETRON HCL 4 MG/2ML IJ SOLN: 4.0000 mg | INTRAMUSCULAR | Status: DC | PRN

## 2021-04-19 MED ORDER — WITCH HAZEL-GLYCERIN EX PADS: 1.0000 "application " | MEDICATED_PAD | CUTANEOUS | Status: DC | PRN

## 2021-04-19 MED ORDER — ONDANSETRON HCL 4 MG PO TABS: 4.0000 mg | ORAL_TABLET | ORAL | Status: DC | PRN

## 2021-04-19 MED ORDER — LEVONORGESTREL 20.1 MCG/DAY IU IUD: 1.0000 | INTRAUTERINE_SYSTEM | Freq: Once | INTRAUTERINE | Status: AC

## 2021-04-19 MED ORDER — SODIUM BICARBONATE 8.4 % IV SOLN
INTRAVENOUS | Status: DC | PRN
  Administered 2021-04-19: 5 mL via EPIDURAL

## 2021-04-19 MED ORDER — PRENATAL MULTIVITAMIN CH
1.0000 | ORAL_TABLET | Freq: Every day | ORAL | Status: DC
  Administered 2021-04-19 – 2021-04-20 (×2): 1 via ORAL
  Filled 2021-04-19 (×2): qty 1

## 2021-04-19 NOTE — Discharge Summary (Signed)
Postpartum Discharge Summary    Patient Name: Connie Mcgee DOB: Aug 09, 1981 MRN: [id number]  Date of admission: 04/18/2021 Delivery date:04/19/2021  Delivering provider: Randa Ngo  Date of discharge: 04/20/2021  Admitting diagnosis: Gestational diabetes mellitus (GDM) in third trimester [O24.419] Intrauterine pregnancy: [redacted]w[redacted]d    Secondary diagnosis:  Principal Problem:   VBAC (vaginal birth after Cesarean) Active Problems:   History of cesarean section   Gestational diabetes mellitus (GDM), antepartum   Advanced maternal age in multigravida, third trimester   GBS (group B Streptococcus carrier), +RV culture, currently pregnant   Gestational diabetes mellitus (GDM) in third trimester   Encounter for IUD insertion  Additional problems: as noted above  Discharge diagnosis: Term Pregnancy Delivered, VBAC                                              Post partum procedures:post-placental IUD insertion Augmentation: AROM, Pitocin and IP Foley Complications: None  Hospital course: Induction of Labor With Vaginal Delivery   40y.o. yo G4P3 at 347w1das admitted to the hospital 04/18/2021 for induction of labor.  Indication for induction: [name]. On admission, FB was placed and pitocin was started. AROM for clear fluid and then patient progressed to complete cervical dilation. Membrane Rupture Time/Date: 8:52 PM ,04/18/2021   Delivery Method:Vaginal, Spontaneous  Episiotomy: None  Lacerations:  None  Details of delivery can be found in separate delivery note. Post-placental Liletta IUD placed. Patient had a routine postpartum course. Patient is discharged home 04/20/21.  Newborn Data: Birth date:04/19/2021  Birth time:6:20 AM  Gender:Female  Living status:Living  Apgars:8 ,9  Weight:3056 g   Magnesium Sulfate received: No BMZ received: No Rhophylac:N/A MMR:N/A T-DaP:Given prenatally Flu: offered prior to discharge Transfusion:No  Physical exam  Vitals:   04/19/21  1345 04/19/21 1709 04/19/21 2150 04/20/21 0719  BP: 122/66 129/61 124/65 127/68  Pulse: 84 83 81 87  Resp: '18 16 18 16  ' Temp: 98.3 F (36.8 C) 98 F (36.7 C) 99.1 F (37.3 C) 98.2 F (36.8 C)  TempSrc: Oral Oral Oral Oral  SpO2: 100% 100%  99%  Weight:      Height:       General: alert, cooperative and no distress Lochia: appropriate Uterine Fundus: firm Incision: N/A DVT Evaluation: No evidence of DVT seen on physical exam. No cords or calf tenderness. No significant calf/ankle edema. Labs: Lab Results  Component Value Date   WBC 7.1 04/18/2021   HGB 11.4 (L) 04/18/2021   HCT 34.8 (L) 04/18/2021   MCV 93.3 04/18/2021   PLT 222 04/18/2021   No flowsheet data found. Edinburgh Score: Edinburgh Postnatal Depression Scale Screening Tool 04/19/2021  I have been able to laugh and see the funny side of things. 0  I have looked forward with enjoyment to things. 0  I have blamed myself unnecessarily when things went wrong. 0  I have been anxious or worried for no good reason. 0  I have felt scared or panicky for no good reason. 0  Things have been getting on top of me. 0  I have been so unhappy that I have had difficulty sleeping. 0  I have felt sad or miserable. 0  I have been so unhappy that I have been crying. 0  The thought of harming myself has occurred to me. 0  Edinburgh Postnatal Depression Scale Total  0     After visit meds:  Allergies as of 04/20/2021   No Known Allergies     Medication List    STOP taking these medications   Accu-Chek Guide test strip Generic drug: glucose blood   Accu-Chek Softclix Lancets lancets     TAKE these medications   Accu-Chek Guide w/Device Kit 1 Device by Does not apply route 4 (four) times daily.   acetaminophen 325 MG tablet Commonly known as: Tylenol Take 2 tablets (650 mg total) by mouth every 6 (six) hours as needed for mild pain, moderate pain, fever or headache.   coconut oil Oil Apply 1 application topically as  needed (nipple pain).   ibuprofen 600 MG tablet Commonly known as: ADVIL Take 1 tablet (600 mg total) by mouth every 6 (six) hours.   multivitamin-prenatal 27-0.8 MG Tabs tablet Take 1 tablet by mouth daily at 12 noon.        Discharge home in stable condition Infant Feeding: Bottle Infant Disposition:home with mother Discharge instruction: per After Visit Summary and Postpartum booklet. Activity: Advance as tolerated. Pelvic rest for 6 weeks.  Diet: routine diet Future Appointments: Future Appointments  Date Time Provider [place]  05/31/2021  9:00 AM [facility] None  05/31/2021  9:45 AM Sloan Leiter, MD [facility] None   Follow up Visit: Message sent to [facility] by Dr. Astrid Drafts.  Please schedule this patient for a In person postpartum visit in 6 weeks with the following provider: Any provider. Additional Postpartum F/U:2 hour GTT  Low risk pregnancy complicated by: [id number], h/o Cesarean with successful VBAC x1 Delivery mode:  Vaginal, Spontaneous  Anticipated Birth Control:  PP IUD placed  Gwynneth Fabio, Gildardo Cranker, MD OB Fellow, Faculty Practice 04/20/2021 10:10 AM

## 2021-04-19 NOTE — Progress Notes (Signed)
Labor Progress Note [name] is a 40 y.o. G4P3 at [redacted]w[redacted]d presented for IOL secondary to [name], desire to [facility].  S: Pt had some relief from re-bolus of epidural, but again endorsing increasing vaginal pain and pressure despite epidural. No other concerns at this time.  O:  BP 125/71   Pulse 90   Temp (!) 97.3 F (36.3 C) (Oral)   Resp 16   Ht 5\' 4"  (1.626 m)   Wt 87.2 kg   LMP 07/19/2020   SpO2 100%   BMI 32.99 kg/m  EFM: baseline 120/Moderate variability/no accels/intermittent early decels Toco: ctx every 2-3 min, 200 MVUs Pit @20  milli-units/min since 0200  CVE: Dilation: 6 Effacement (%): 90 Station: Plus 1 Presentation: Vertex Exam by:: Dr.   A&P: 40 y.o. G4P3 [redacted]w[redacted]d presented for TOLAC IOL for [name]. #Labor: S/p FB (out at 1447). Pitocin continued since 1045. Now s/p AROM for large volume clear fluid at 2055. IUPC placed to facilitate titration of pitocin at time of AROM; contractions now adequate. Reassuringly, continued advancement in fetal station on most recent exam. Will continue pitocin at 20 and continue to monitor closely. #Pain: epidural in place; s/p re-bolus x1 with anesthesia. #FWB: Cat 1 strip  #GBS positive, adequate treatment with Penicillin #[name]:  low normal BG levels on admission. Will continue to monitor.  [redacted]w[redacted]d, MD 3:56 AM

## 2021-04-19 NOTE — Progress Notes (Signed)
Labor Progress Note Connie Mcgee is a 40 y.o. G4P3 at [redacted]w[redacted]d presented for IOL secondary to [name], desire to [facility].  S: Pt endorsing increasing vaginal discomfort with contractions despite epidural. Desires re-bolus of epidural at this time.  O:  BP 128/82   Pulse 77   Temp 98.4 F (36.9 C) (Oral)   Resp 16   Ht 5\' 4"  (1.626 m)   Wt 87.2 kg   LMP 07/19/2020   BMI 32.99 kg/m  EFM: baseline 130/Moderate variability/no accels/recurrent early decels, intermittent variable decels Toco: ctx every 2-3 min, 150 MVUs  CVE: Dilation: 5.5 Effacement (%): 90 Station: 0 Presentation: Vertex Exam by:: Dr. [email or web address]   A&P: 40 y.o. G4P3 [redacted]w[redacted]d presented for TOLAC IOL for [name]. #Labor: S/p FB (out at 1447). Pitocin continued since 1045. Now s/p AROM for large volume clear fluid at 2055. IUPC placed to facilitate titration of pitocin at time of AROM; not yet adequate with contractions. Reassuringly, notable advancement in fetal station on most recent exam. #Pain: epidural in place; plan for re-bolus with anesthesia per pt request #FWB: Cat 2 strip given intermittent variable decels; reassuringly, moderate variability and good recovery s/p variable decels. May consider amnioinfusion if variable decels become recurrent. #GBS positive, adequate treatment with Penicillin #[name]:  low normal BG levels on admission. Will continue to monitor.  2056, MD 12:23 AM

## 2021-04-19 NOTE — Discharge Instructions (Signed)
Postpartum Care After Vaginal Delivery The following information offers guidance about how to care for yourself from the time you deliver your baby to 6-12 weeks after delivery (postpartum period). If you have problems or questions, contact your health care provider for more specific instructions. Follow these instructions at home: Vaginal bleeding  It is normal to have vaginal bleeding (lochia) after delivery. Wear a sanitary pad for bleeding and discharge. ? During the first week after delivery, the amount and appearance of lochia is often similar to a menstrual period. ? Over the next few weeks, it will gradually decrease to a dry, yellow-brown discharge. ? For most women, lochia stops completely by 4-6 weeks after delivery, but can vary.  Change your sanitary pads frequently. Watch for any changes in your flow, such as: ? A sudden increase in volume. ? A change in color. ? Large blood clots.  If you pass a blood clot from your vagina, save it and call your health care provider. Do not flush blood clots down the toilet before talking with your health care provider.  Do not use tampons or douches until your health care provider approves.  If you are not breastfeeding, your period should return 6-8 weeks after delivery. If you are feeding your baby breast milk only, your period may not return until you stop breastfeeding. Perineal care  Keep the area between the vagina and the anus (perineum) clean and dry. Use medicated pads and pain-relieving sprays and creams as directed.  If you had a surgical cut in the perineum (episiotomy) or a tear, check the area for signs of infection until you are healed. Check for: ? More redness, swelling, or pain. ? Fluid or blood coming from the cut or tear. ? Warmth. ? Pus or a bad smell.  You may be given a squirt bottle to use instead of wiping to clean the perineum area after you use the bathroom. Pat the area gently to dry it.  To relieve pain  caused by an episiotomy, a tear, or swollen veins in the anus (hemorrhoids), take a warm sitz bath 2-3 times a day. In a sitz bath, the warm water should only come up to your hips and cover your buttocks.   Breast care  In the first few days after delivery, your breasts may feel heavy, full, and uncomfortable (breast engorgement). Milk may also leak from your breasts. Ask your health care provider about ways to help relieve the discomfort.  If you are breastfeeding: ? Wear a bra that supports your breasts and fits well. Use breast pads to absorb milk that leaks. ? Keep your nipples clean and dry. Apply creams and ointments as told. ? You may have uterine contractions every time you breastfeed for up to several weeks after delivery. This helps your uterus return to its normal size. ? If you have any problems with breastfeeding, notify your health care provider or lactation consultant.  If you are not breastfeeding: ? Avoid touching your breasts. Do not squeeze out (express) milk. Doing this can make your breasts produce more milk. ? Wear a good-fitting bra and use cold packs to help with swelling. Intimacy and sexuality  Ask your health care provider when you can engage in sexual activity. This may depend upon: ? Your risk of infection. ? How fast you are healing. ? Your comfort and desire to engage in sexual activity.  You are able to get pregnant after delivery, even if you have not had your period. Talk with   your health care provider about methods of birth control (contraception) or family planning if you desire future pregnancies. Medicines  Take over-the-counter and prescription medicines only as told by your health care provider.  Take an over-the-counter stool softener to help ease bowel movements as told by your health care provider.  If you were prescribed an antibiotic medicine, take it as told by your health care provider. Do not stop taking the antibiotic even if you start to  feel better.  Review all previous and current prescriptions to check for possible transfer into breast milk. Activity  Gradually return to your normal activities as told by your health care provider.  Rest as much as possible. Nap while your baby is sleeping. Eating and drinking  Drink enough fluid to keep your urine pale yellow.  To help prevent or relieve constipation, eat high-fiber foods every day.  Choose healthy eating to support breastfeeding or weight loss goals.  Take your prenatal vitamins until your health care provider tells you to stop.   General tips/recommendations  Do not use any products that contain nicotine or tobacco. These products include cigarettes, chewing tobacco, and vaping devices, such as e-cigarettes. If you need help quitting, ask your health care provider.  Do not drink alcohol, especially if you are breastfeeding.  Do not take medications or drugs that are not prescribed to you, especially if you are breastfeeding.  Visit your health care provider for a postpartum checkup within the first 3-6 weeks after delivery.  Complete a comprehensive postpartum visit no later than 12 weeks after delivery.  Keep all follow-up visits for you and your baby. Contact a health care provider if:  You feel unusually sad or worried.  Your breasts become red, painful, or hard.  You have a fever or other signs of an infection.  You have bleeding that is soaking through one pad an hour or you have blood clots.  You have a severe headache that doesn't go away or you have vision changes.  You have nausea and vomiting and are unable to eat or drink anything for 24 hours. Get help right away if:  You have chest pain or difficulty breathing.  You have sudden, severe leg pain.  You faint or have a seizure.  You have thoughts about hurting yourself or your baby. If you ever feel like you may hurt yourself or others, or have thoughts about taking your own life,  get help right away. Go to your nearest emergency department or:  Call your local emergency services (911 in the [place]  The National Suicide Prevention Lifeline at 930-608-4940. This suicide crisis helpline is open 24 hours a day.  Text the Crisis Text Line at (587) 116-5096 (in the [place] Summary  The period of time after you deliver your newborn up to 6-12 weeks after delivery is called the postpartum period.  Keep all follow-up visits for you and your baby.  Review all previous and current prescriptions to check for possible transfer into breast milk.  Contact a health care provider if you feel unusually sad or worried during the postpartum period. This information is not intended to replace advice given to you by your health care provider. Make sure you discuss any questions you have with your health care provider. Document Revised: 08/11/2020 Document Reviewed: 08/11/2020 Elsevier Patient Education  2021 [employer].   Intrauterine Device Insertion, Care After This sheet gives you information about how to care for yourself after your procedure. Your health care provider may also  give you more specific instructions. If you have problems or questions, contact your health care provider. What can I expect after the procedure? After the procedure, it is common to have:  Cramps and pain in the abdomen.  Bleeding. It may be light or heavy. This may last for a few days.  Lower back pain.  Dizziness.  Headaches.  Nausea. Follow these instructions at home:  Before resuming sexual activity, check to make sure that you can feel the IUD string or strings. You should be able to feel the end of the string below the opening of your cervix. If your IUD string is in place, you may resume sexual activity. ? If you had a hormonal IUD inserted more than 7 days after your most recent period started, you will need to use a backup method of birth control for 7 days after IUD insertion. Ask your  health care provider whether this applies to you.  Continue to check that the IUD is still in place by feeling for the strings after every menstrual period, or once a month.  An IUD will not protect you from sexually transmitted infections (STIs). Use methods to prevent the exchange of body fluids between partners (barrier protection) every time you have sex. Barrier protection can be used during oral, vaginal, or anal sex. Commonly used barrier methods include: ? Female condom. ? Female condom. ? Dental dam.  Take over-the-counter and prescription medicines only as told by your health care provider.  Keep all follow-up visits as told by your health care provider. This is important.   Contact a health care provider if:  You feel light-headed or weak.  You have any of the following problems with your IUD string or strings: ? The string bothers or hurts you or your sexual partner. ? You cannot feel the string. ? The string has gotten longer.  You can feel the IUD in your vagina.  You think you may be pregnant, or you miss your menstrual period.  You think you may have a sexually transmitted infection (STI). Get help right away if:  You have flu-like symptoms, such as tiredness (fatigue) and muscle aches.  You have a fever and chills.  You have bleeding that is heavier or lasts longer than a normal menstrual cycle.  You have abnormal or bad-smelling discharge from your vagina.  You develop abdominal pain that is new, is getting worse, or is not in the same area of earlier cramping and pain.  You have pain during sexual activity. Summary  After the procedure, it is common to have cramps and pain in the abdomen. It is also common to have light bleeding or heavier bleeding that is like your menstrual period.  Continue to check that the IUD is still in place by feeling for the strings after every menstrual period, or once a month.  Keep all follow-up visits as told by your health  care provider. This is important.  Contact your health care provider if you have problems with your IUD strings, such as the string getting longer or bothering you or your sexual partner. This information is not intended to replace advice given to you by your health care provider. Make sure you discuss any questions you have with your health care provider. Document Revised: 11/17/2019 Document Reviewed: 11/17/2019 Elsevier Patient Education  2021 Elsevier Inc.  

## 2021-04-20 DIAGNOSIS — O99355 Diseases of the nervous system complicating the puerperium: Secondary | ICD-10-CM

## 2021-04-20 DIAGNOSIS — Z30430 Encounter for insertion of intrauterine contraceptive device: Secondary | ICD-10-CM

## 2021-04-20 DIAGNOSIS — G8918 Other acute postprocedural pain: Secondary | ICD-10-CM

## 2021-04-20 LAB — GLUCOSE, CAPILLARY: Glucose-Capillary: 78 mg/dL (ref 70–99)

## 2021-04-20 MED ORDER — ACETAMINOPHEN 325 MG PO TABS: 650.0000 mg | ORAL_TABLET | Freq: Four times a day (QID) | ORAL | Status: AC | PRN

## 2021-04-20 MED ORDER — IBUPROFEN 600 MG PO TABS: 600.0000 mg | ORAL_TABLET | Freq: Four times a day (QID) | ORAL | 0 refills | Status: AC

## 2021-04-20 MED ORDER — COCONUT OIL OIL: 1.0000 "application " | TOPICAL_OIL | 0 refills | Status: AC | PRN

## 2021-04-20 NOTE — Anesthesia Postprocedure Evaluation (Signed)
Anesthesia Post Note  Patient: Connie Mcgee  Procedure(s) Performed: AN AD HOC LABOR EPIDURAL     Patient location during evaluation: Mother Baby Anesthesia Type: Epidural Level of consciousness: awake and alert and oriented Pain management: satisfactory to patient Vital Signs Assessment: post-procedure vital signs reviewed and stable Respiratory status: respiratory function stable Cardiovascular status: stable and unstable Postop Assessment: no headache, no backache, epidural receding, patient able to bend at knees, no signs of nausea or vomiting and adequate PO intake Anesthetic complications: no   No complications documented.  Last Vitals:  Vitals:   04/19/21 2150 04/20/21 0719  BP: 124/65 127/68  Pulse: 81 87  Resp: 18 16  Temp: 37.3 C 36.8 C  SpO2:  99%    Last Pain:  Vitals:   04/20/21 0719  TempSrc: Oral  PainSc:    Pain Goal: Patients Stated Pain Goal: 0 (04/20/21 0007)                 Karleen Dolphin

## 2021-05-31 ENCOUNTER — Other Ambulatory Visit: Payer: Medicaid Other

## 2021-05-31 ENCOUNTER — Ambulatory Visit: Payer: Medicaid Other | Admitting: Obstetrics and Gynecology

## 2021-06-15 ENCOUNTER — Other Ambulatory Visit: Payer: Self-pay

## 2021-06-15 ENCOUNTER — Other Ambulatory Visit: Payer: Medicaid Other

## 2021-06-15 ENCOUNTER — Ambulatory Visit (INDEPENDENT_AMBULATORY_CARE_PROVIDER_SITE_OTHER): Payer: Medicaid Other | Admitting: Advanced Practice Midwife

## 2021-06-15 ENCOUNTER — Encounter: Payer: Self-pay | Admitting: Advanced Practice Midwife

## 2021-06-15 VITALS — BP 137/82 | HR 65 | Ht 62.0 in | Wt 169.0 lb

## 2021-06-15 DIAGNOSIS — Z3009 Encounter for other general counseling and advice on contraception: Secondary | ICD-10-CM

## 2021-06-15 DIAGNOSIS — O0990 Supervision of high risk pregnancy, unspecified, unspecified trimester: Secondary | ICD-10-CM

## 2021-06-15 DIAGNOSIS — Z30433 Encounter for removal and reinsertion of intrauterine contraceptive device: Secondary | ICD-10-CM | POA: Diagnosis not present

## 2021-06-15 DIAGNOSIS — T8332XA Displacement of intrauterine contraceptive device, initial encounter: Secondary | ICD-10-CM | POA: Diagnosis not present

## 2021-06-15 DIAGNOSIS — N921 Excessive and frequent menstruation with irregular cycle: Secondary | ICD-10-CM

## 2021-06-15 DIAGNOSIS — Z975 Presence of (intrauterine) contraceptive device: Secondary | ICD-10-CM

## 2021-06-15 DIAGNOSIS — M545 Low back pain, unspecified: Secondary | ICD-10-CM

## 2021-06-15 DIAGNOSIS — Z30430 Encounter for insertion of intrauterine contraceptive device: Secondary | ICD-10-CM

## 2021-06-15 MED ORDER — LEVONORGESTREL 20.1 MCG/DAY IU IUD
1.0000 | INTRAUTERINE_SYSTEM | Freq: Once | INTRAUTERINE | Status: AC
Start: 2021-06-15 — End: 2021-06-15
  Administered 2021-06-15: 1 via INTRAUTERINE

## 2021-06-15 MED ORDER — MEDROXYPROGESTERONE ACETATE 10 MG PO TABS: 10.0000 mg | ORAL_TABLET | Freq: Every day | ORAL | 0 refills | Status: AC

## 2021-06-15 NOTE — Progress Notes (Signed)
Post Partum Visit Note  Connie Mcgee is a 40 y.o. G4P3 female who presents for a postpartum visit. She is 7 weeks postpartum following a normal spontaneous vaginal delivery.  I have fully reviewed the prenatal and intrapartum course. The delivery was at [redacted]w[redacted]d gestational weeks.  Anesthesia: epidural. Postpartum course has been uncomplicated. Baby is doing well. Baby is feeding by bottle - Enfacare. Bleeding pink. Bowel function is normal. Bladder function is normal. Patient is not sexually active. Contraception method is IUD. Postpartum depression screening: negative.   The pregnancy intention screening data noted above was reviewed. Potential methods of contraception were discussed. The patient elected to proceed with IUD or IUS.    Edinburgh Postnatal Depression Scale - 06/15/21 1018       Edinburgh Postnatal Depression Scale:  In the Past 7 Days   I have been able to laugh and see the funny side of things. 0    I have looked forward with enjoyment to things. 0    I have blamed myself unnecessarily when things went wrong. 0    I have been anxious or worried for no good reason. 0    I have felt scared or panicky for no good reason. 0    Things have been getting on top of me. 0    I have been so unhappy that I have had difficulty sleeping. 0    I have felt sad or miserable. 0    I have been so unhappy that I have been crying. 0    The thought of harming myself has occurred to me. 0    Edinburgh Postnatal Depression Scale Total 0             Health Maintenance Due  Topic Date Due   COVID-19 Vaccine (1) Never done   Pneumococcal Vaccine 25-1 Years old (1 - PCV) Never done   URINE MICROALBUMIN  Never done   Hepatitis C Screening  Never done    The following portions of the patient's history were reviewed and updated as appropriate: allergies, current medications, past family history, past medical history, past social history, past surgical history, and problem  list.  Review of Systems Pertinent items noted in HPI and remainder of comprehensive ROS otherwise negative.  Objective:  BP 137/82   Pulse 65   Ht 5\' 2"  (1.575 m)   Wt 169 lb (76.7 kg)   Breastfeeding No   BMI 30.91 kg/m    VS reviewed, nursing note reviewed,  Constitutional: well developed, well nourished, no distress HEENT: normocephalic CV: normal rate Pulm/chest wall: normal effort Breast Exam:  deferred Abdomen: soft Neuro: alert and oriented x 3 Skin: warm, dry Psych: affect normal Pelvic exam: Cervix pink, visually closed, IUD visualized at external os, see procedure note below, scant white creamy discharge, vaginal walls and external genitalia normal   IUD Procedure Note  IUD Removal and Reinsertion  Patient identified, informed consent performed.   Patient was in the dorsal lithotomy position, normal external genitalia was noted.  A speculum was placed in the patient's vagina, normal discharge was noted, no lesions. The multiparous cervix was visualized, no lesions, no abnormal discharge.  The IUD was visualized at the external os of the cervix so was removed during today's exam.  he strings of the IUD were grasped and pulled using ring forceps. The IUD was removed in its entirety.  Patient tolerated the procedure well.      Discussed risks of irregular bleeding, cramping, infection,  malpositioning or misplacement of the IUD outside the uterus which may require further procedures. Time out was performed.    Speculum placed in the vagina.  Cervix visualized.  Cleaned with Betadine x 2.  Grasped anteriorly with a single tooth tenaculum.  Uterus sounded to 9 cm. Liletta IUD placed per manufacturer's recommendations.  Strings trimmed to 3 cm. Tenaculum was removed, good hemostasis noted.  Patient tolerated procedure well.   Patient was given post-procedure instructions and the Liletta care card with expiration date.  Patient was also asked to check IUD strings  periodically and follow up in 4-6 weeks for IUD check.   Assessment:  1. Supervision of high risk pregnancy, antepartum  - Glucose tolerance, 2 hours  2. Encounter for removal and reinsertion of intrauterine contraceptive device (IUD) --IUD was found to be partially expelled and was removed today. See procedure note above. --Liletta IUD reinserted at pt request, pt tolerated well --F/U in 4 weeks for string check  3. Breakthrough bleeding associated with intrauterine device (IUD) --Daily bleeding since delivery, sometimes light, sometimes menstrual like and moderate.   --May be due to malposition of IUD, partial expulsion.   --Discussed tx options with pt, some risk factors to estrogen with age >35 and light smoker so Rx for Provera sent to pharmacy.   --Pt to take Provera if still bleeding after new IUD insertion in 10 days.  Bleeding with IUD may be irregular but should be light and not continuous after 2-3 months.  Pt states understanding.   4. Acute midline low back pain without sciatica --Pt to try heating pad, will f/u if desires PT for back pain    Plan:   Essential components of care per ACOG recommendations:  1.  Mood and well being: Patient with negative depression screening today. Reviewed local resources for support.  - Patient tobacco use? Yes. Patient desires to quit? Yes.Discussed reduction and cessation  - hx of drug use? No.    2. Infant care and feeding:  -Patient currently breastmilk feeding? No.  -Social determinants of health (SDOH) reviewed in EPIC. No concerns   3. Sexuality, contraception and birth spacing - Patient does not want a pregnancy in the next year.  - Reviewed forms of contraception in tiered fashion. Patient desired IUD today.   - Discussed birth spacing of 18 months  4. Sleep and fatigue -Encouraged family/partner/community support of 4 hrs of uninterrupted sleep to help with mood and fatigue  5. Physical Recovery  - Discussed patients  delivery and complications. She describes her labor as good. - Patient had a Vaginal, no problems at delivery. Patient had no laceration. Perineal healing reviewed. Patient expressed understanding - Patient has urinary incontinence? No. - Patient is safe to resume physical and sexual activity, wait 3-4 days after IUD insertion  6.  Health Maintenance - HM due items addressed Yes - Last pap smear 08/25/2020 at [facility].  Wnl, neg HPV.  Pap smear not done at today's visit.  -Breast Cancer screening indicated? No.   7. Chronic Disease/Pregnancy Condition follow up: None  - PCP follow up  Sharen Counter, CNM [facility] [employer], [facility]

## 2021-06-16 LAB — GLUCOSE TOLERANCE, 2 HOURS
Glucose, 2 hour: 101 mg/dL (ref 65–139)
Glucose, GTT - Fasting: 95 mg/dL (ref 65–99)

## 2021-06-19 ENCOUNTER — Telehealth: Payer: Self-pay

## 2021-06-19 NOTE — Telephone Encounter (Signed)
Patient has been notified of test results.  

## 2021-06-19 NOTE — Telephone Encounter (Signed)
-----   Message from Hurshel Party, CNM sent at 06/16/2021  9:13 AM EDT ----- Pt does not have MyChart. Please call her to tell her she passed her postpartum glucose test so does not have diabetes at this time.  Thank you.

## 2021-06-19 NOTE — Telephone Encounter (Signed)
Call patient, to make her aware of test results. No answer or voice mail to leave a message.

## 2021-06-19 NOTE — Telephone Encounter (Signed)
-----   Message from Lisa A Leftwich-Kirby, CNM sent at 06/16/2021  9:13 AM EDT ----- Pt does not have MyChart. Please call her to tell her she passed her postpartum glucose test so does not have diabetes at this time.  Thank you.  

## 2021-07-13 ENCOUNTER — Ambulatory Visit: Payer: Medicaid Other | Admitting: Obstetrics and Gynecology

## 2021-07-18 ENCOUNTER — Encounter: Payer: Self-pay | Admitting: Obstetrics

## 2021-07-18 ENCOUNTER — Other Ambulatory Visit: Payer: Self-pay

## 2021-07-18 ENCOUNTER — Ambulatory Visit (INDEPENDENT_AMBULATORY_CARE_PROVIDER_SITE_OTHER): Payer: Medicaid Other | Admitting: Obstetrics

## 2021-07-18 VITALS — BP 145/95 | HR 83 | Ht 62.0 in | Wt 167.2 lb

## 2021-07-18 DIAGNOSIS — Z30431 Encounter for routine checking of intrauterine contraceptive device: Secondary | ICD-10-CM

## 2021-07-18 NOTE — Progress Notes (Signed)
Subjective:    Connie Mcgee  who presents for contraception counseling. The patient has no complaints today. The patient is sexually active. Pertinent past medical history: none.  Likes her Mirena IUD.  Is not currently bleeding with the IUD.    The information documented in the HPI was reviewed and verified.  Menstrual History: OB History     Gravida Para Term Preterm AB Living   _0 SAB TAB Ectopic Multiple Live Births         0 1       LMP:  07-15-2021   Patient Active Problem List   Diagnosis Date Noted   Patient Active Problem List   Diagnosis Date Noted   Encounter for IUD insertion 04/20/2021   VBAC (vaginal birth after Cesarean) 04/19/2021   Gestational diabetes mellitus (GDM), antepartum 02/01/2021   History of cesarean section 12/20/2020    No past medical history on file.  Past Surgical History:  Procedure Laterality Date   Past Surgical History:  Procedure Laterality Date   CESAREAN SECTION        Current Outpatient Prescriptions:  No medication comments found. Current Outpatient Medications on File Prior to Visit  Medication Sig Dispense Refill   acetaminophen (TYLENOL) 325 MG tablet Take 2 tablets (650 mg total) by mouth every 6 (six) hours as needed for mild pain, moderate pain, fever or headache. (Patient not taking: Reported on 06/15/2021)     Blood Glucose Monitoring Suppl (ACCU-CHEK GUIDE) w/Device KIT 1 Device by Does not apply route 4 (four) times daily. 1 kit 0   coconut oil OIL Apply 1 application topically as needed (nipple pain). (Patient not taking: Reported on 06/15/2021)  0   ibuprofen (ADVIL) 600 MG tablet Take 1 tablet (600 mg total) by mouth every 6 (six) hours. (Patient not taking: Reported on 06/15/2021) 30 tablet 0   medroxyPROGESTERone (PROVERA) 10 MG tablet Take 1 tablet (10 mg total) by mouth daily. 10 tablet 0   Prenatal Vit-Fe Fumarate-FA (MULTIVITAMIN-PRENATAL) 27-0.8 MG TABS tablet Take 1 tablet by mouth daily at 12  noon.     No current facility-administered medications on file prior to visit.    Allergies  Allergen Reactions  No Known Allergies   Social History  Substance Use Topics   Social History   Socioeconomic History   Marital status: Single    Spouse name: Not on file   Number of children: Not on file   Years of education: Not on file   Highest education level: Not on file  Occupational History   Not on file  Tobacco Use   Smoking status: Every Day   Smokeless tobacco: Never   Tobacco comments:    2-3 cigs/day  Vaping Use   Vaping Use: Never used  Substance and Sexual Activity   Alcohol use: Not Currently   Drug use: Not Currently   Sexual activity: Not on file  Other Topics Concern   Not on file  Social History Narrative   Not on file   Social Determinants of Health   Financial Resource Strain: Not on file  Food Insecurity: Not on file  Transportation Needs: Not on file  Physical Activity: Not on file  Stress: Not on file  Social Connections: Not on file  Intimate Partner Violence: Not on file     No family history on file.     Review of Systems Constitutional: negative for weight loss Genitourinary:negative for abnormal menstrual  periods and vaginal discharge   Objective:   BP 115/77   Pulse 89   Wt 161 lb (73 kg)   BMI 25.22 kg/m    General:   Alert and no distress  Skin:   no rash or abnormalities  Lungs:   clear to auscultation bilaterally  Heart:   regular rate and rhythm, S1, S2 normal, no murmur, click, rub or gallop  Breasts:   deferred  Abdomen:  normal findings: no organomegaly, soft, non-tender and no hernia  Pelvis:  External genitalia: normal general appearance Urinary system: urethral meatus normal and bladder without fullness, nontender Vaginal: normal without tenderness, induration or masses Cervix: normal appearance, IUD strings present Adnexa: normal bimanual exam Uterus: anteverted and non-tender, normal size   Lab  Review Urine pregnancy test Labs reviewed yes Radiologic studies reviewed no  I have spent a total of 15 minutes of face-to-face time, excluding clinical staff time, reviewing notes and preparing to see patient, ordering tests and/or medications, and counseling the patient.   Assessment:    40 y.o., continuing IUD, no contraindications.   Plan:    All questions answered.  Follow up in 6 weeks for Annual / Pap  Shelly Bombard, MD 07/18/2021 2:03 PM

## 2022-03-29 ENCOUNTER — Ambulatory Visit: Payer: Medicaid Other | Admitting: Obstetrics

## 2022-05-08 IMAGING — US US MFM OB FOLLOW-UP
1 series · 13 of 28 positions shown · non-contrast
Comparison: none

[Series 1: us mfm ob follow-up · 51 acquisitions, 13 frames shown]
[im 2/51]
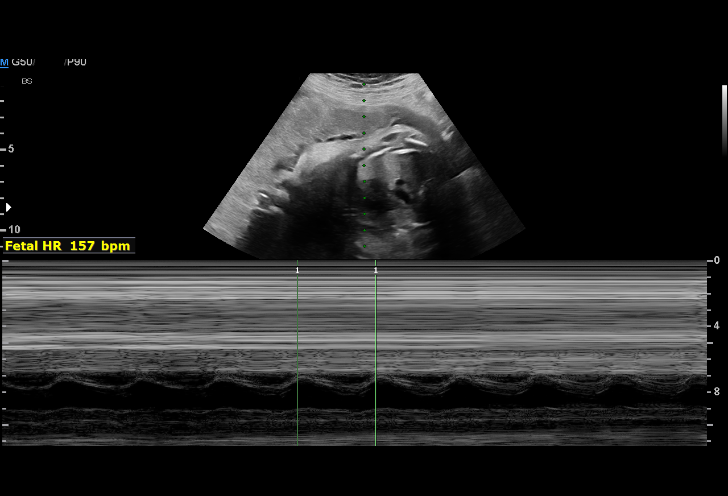
[im 6/51]
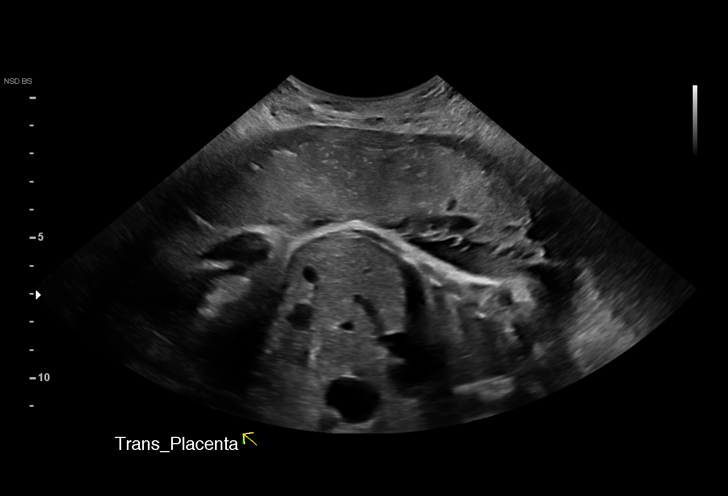
[im 10/51]
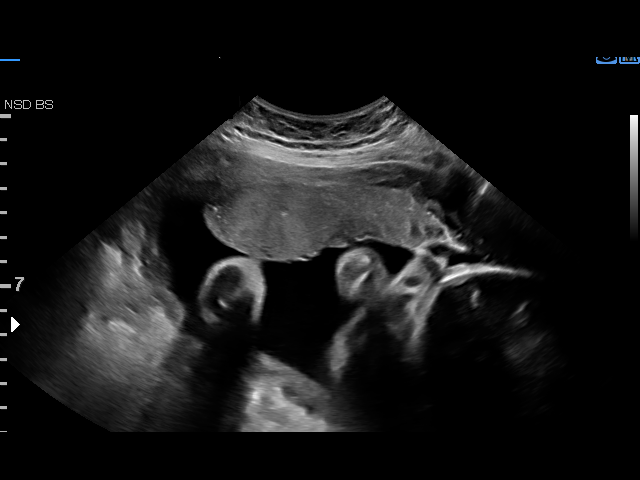
[im 13/51]
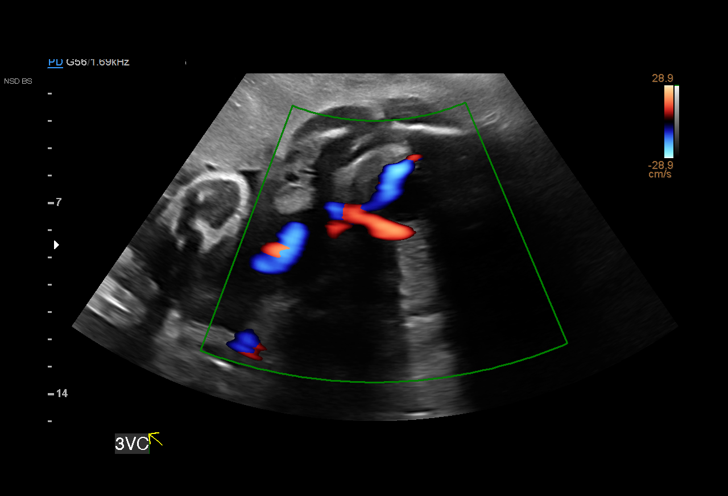
[im 17/51]
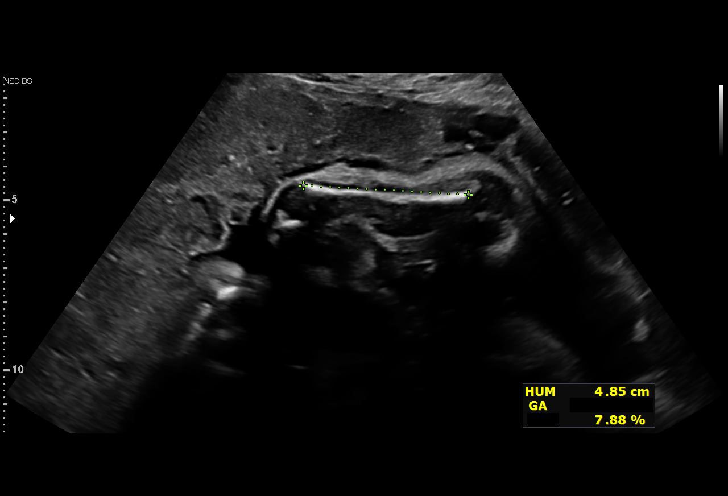
[im 21/51]
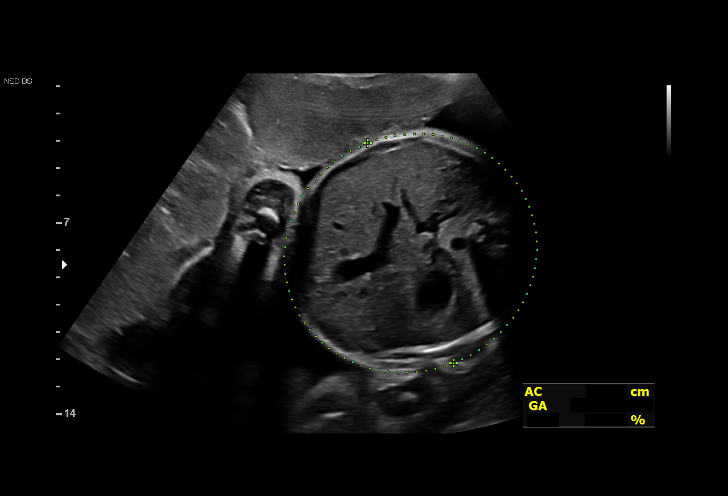
[im 26/51]
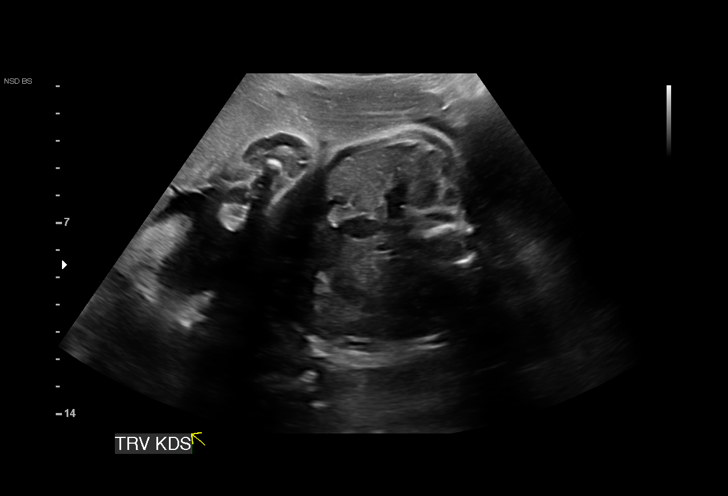
[im 30/51]
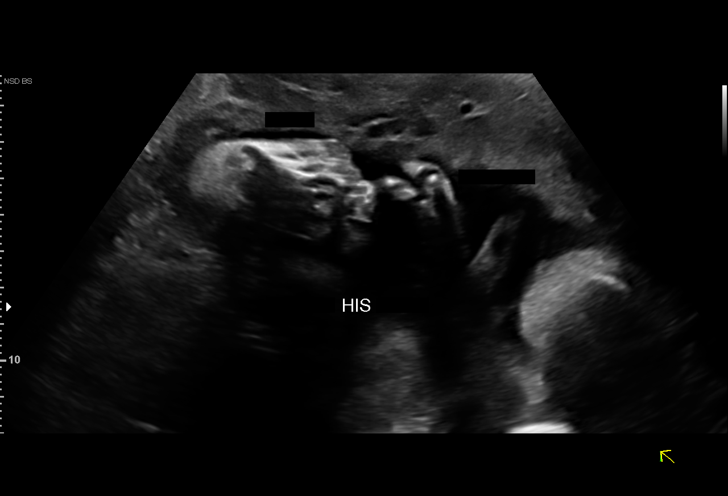
[im 34/51]
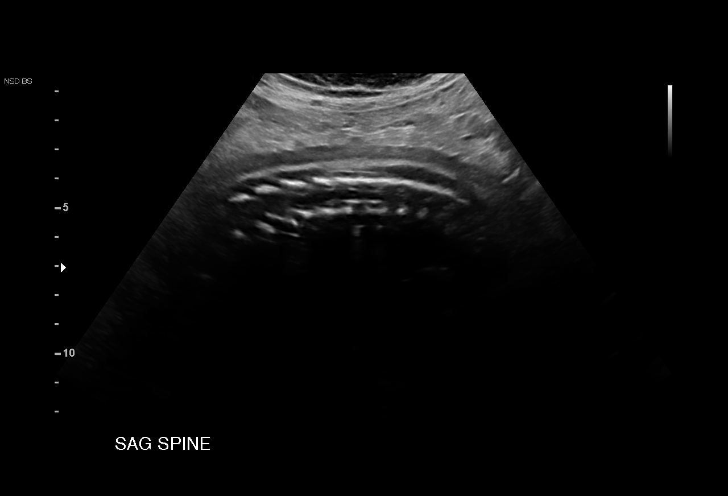
[im 38/51]
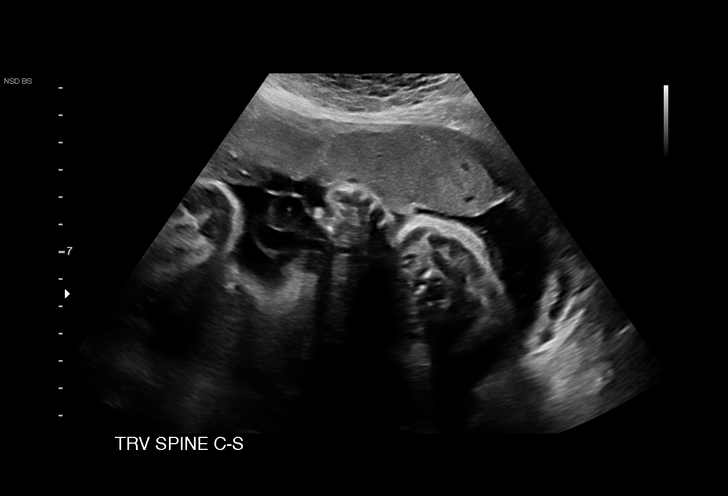
[im 41/51]
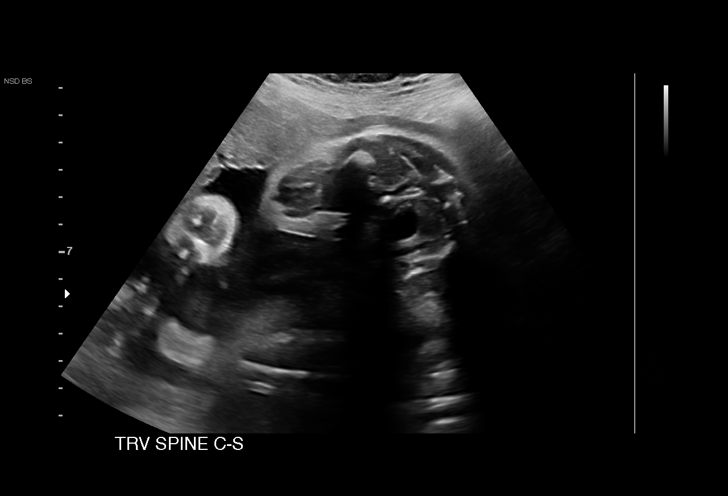
[im 45/51]
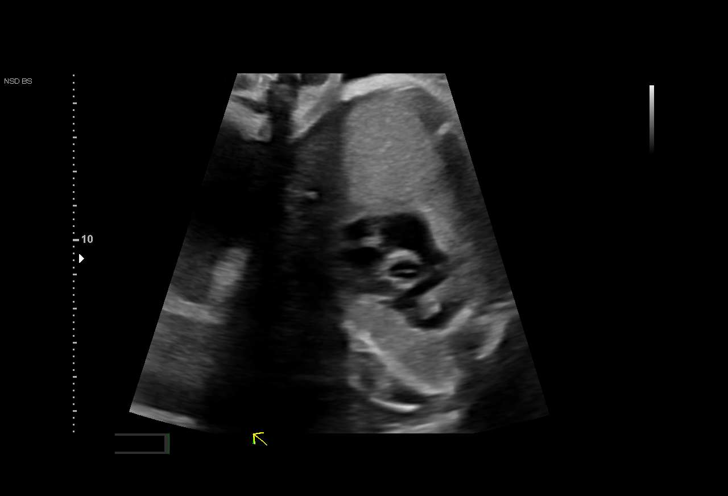
[im 49/51]
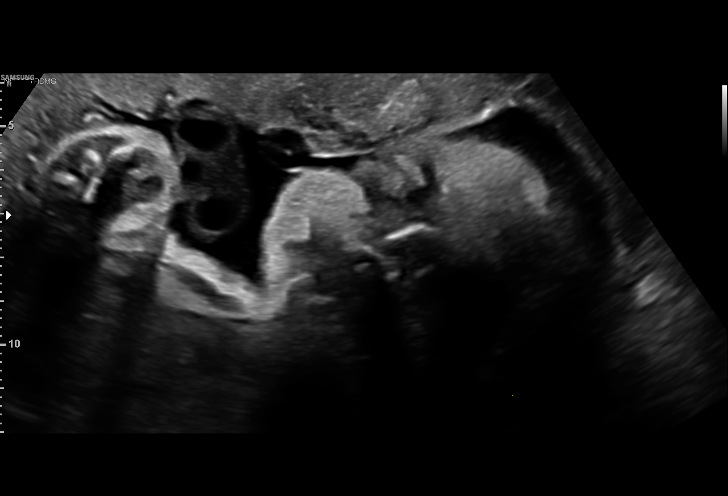

[13 of 28 positions shown; findings below may reference images not displayed]

[REDACTED]
                   [id number]

Indications

 Advanced maternal age multigravida 35+,
 third trimester (39 yrs)
 32 weeks gestation of pregnancy
 Gestational diabetes in pregnancy, diet
 controlled
 Late to prenatal care, third trimester
 Poor obstetric history: Previous fetal growth
 restriction (FGR)
 Encounter for antenatal screening for
 malformations
 History of cesarean delivery, currently
 pregnant x 1
 Tobacco use complicating pregnancy, third
 trimester
Fetal Evaluation

 Num Of Fetuses:         1
 Fetal Heart Rate(bpm):  157
 Cardiac Activity:       Observed
 Presentation:           Cephalic
 Placenta:               Anterior
 P. Cord Insertion:      Previously Visualized
 Amniotic Fluid
 AFI FV:      Within normal limits

 AFI Sum(cm)     %Tile       Largest Pocket(cm)
 13.43           43

 RUQ(cm)       RLQ(cm)       LUQ(cm)        LLQ(cm)

Biometry

 BPD:      77.7  mm     G. Age:  31w 1d         16  %    CI:        73.53   %    70 - 86
                                                         FL/HC:      19.9   %    19.1 -
 HC:      287.9  mm     G. Age:  31w 5d          8  %    HC/AC:      1.02        0.96 -
 AC:      282.7  mm     G. Age:  32w 2d         54  %    FL/BPD:     73.6   %    71 - 87
 FL:       57.2  mm     G. Age:  30w 0d        2.6  %    FL/AC:      20.2   %    20 - 24
 HUM:      49.8  mm     G. Age:  29w 2d        < 5  %
 LV:        3.3  mm

 Est. FW:    3143  gm    3 lb 14 oz      20  %
OB History

 Gravidity:    4         Term:   3        Prem:   0        SAB:   0
 TOP:          0       Ectopic:  0        Living: 3
Gestational Age

 LMP:           32w 1d        Date:  07/19/20                 EDD:   04/25/21
 U/S Today:     31w 2d                                        EDD:   05/01/21
 Best:          32w 1d     Det. By:  LMP  (07/19/20)          EDD:   04/25/21
Anatomy

 Cranium:               Appears normal         Aortic Arch:            Previously seen
 Cavum:                 Previously seen        Ductal Arch:            Previously seen
 Ventricles:            Appears normal         Diaphragm:              Appears normal
 Choroid Plexus:        Previously seen        Stomach:                Appears normal, left
                                                                       sided
 Cerebellum:            Previously seen        Abdomen:                Appears normal
 Posterior Fossa:       Previously seen        Abdominal Wall:         Appears nml (cord
                                                                       insert, abd wall)
 Nuchal Fold:           Not applicable (>20    Cord Vessels:           Previously seen
                        wks GA)
 Face:                  Orbits and profile     Kidneys:                Appear normal
                        previously seen
 Lips:                  Previously seen        Bladder:                Appears normal
 Thoracic:              Appears normal         Spine:                  Limited views
                                                                       appear normal
 Heart:                 Appears normal         Upper Extremities:      Previously seen
                        (4CH, axis, and
                        situs)
 RVOT:                  Previously seen        Lower Extremities:      Previously seen
 LVOT:                  Previously seen

 Other:  Male gender previously seen. Technically difficult due to advanced
         GA and fetal position.
Cervix Uterus Adnexa
 Cervix
 Not visualized (advanced GA >42wks)
Impression

 Follow up growth to complete fetal anatomy, AMA and
 NG3FD
 Normal interval growth with measurements consistent with
 dates
 Good fetal movement and amniotic fluid volume

 Ms. Tiger reports good FBS and 2hr PP, she continues
 to manage her blood sugars with diet.
Recommendations

 Follow up growth in 4 weeks.

## 2022-05-15 ENCOUNTER — Ambulatory Visit (INDEPENDENT_AMBULATORY_CARE_PROVIDER_SITE_OTHER): Payer: Medicaid Other | Admitting: Obstetrics

## 2022-05-15 ENCOUNTER — Other Ambulatory Visit (HOSPITAL_COMMUNITY)
Admission: RE | Admit: 2022-05-15 | Discharge: 2022-05-15 | Disposition: A | Discharge status: Home / Self Care | Payer: Medicaid Other | Source: Ambulatory Visit | Attending: Obstetrics | Admitting: Obstetrics

## 2022-05-15 ENCOUNTER — Encounter: Payer: Self-pay | Admitting: Obstetrics

## 2022-05-15 VITALS — BP 119/87 | HR 85 | Ht 62.0 in | Wt 175.0 lb

## 2022-05-15 DIAGNOSIS — N898 Other specified noninflammatory disorders of vagina: Secondary | ICD-10-CM

## 2022-05-15 DIAGNOSIS — Z113 Encounter for screening for infections with a predominantly sexual mode of transmission: Secondary | ICD-10-CM | POA: Diagnosis not present

## 2022-05-15 DIAGNOSIS — Z01419 Encounter for gynecological examination (general) (routine) without abnormal findings: Secondary | ICD-10-CM | POA: Diagnosis present

## 2022-05-15 NOTE — Progress Notes (Signed)
Patient presents for AEX. Patient would like to have STD testing, yeast, and bv. No other concerns.  Last pap: 08/25/20 Normal

## 2022-05-15 NOTE — Progress Notes (Signed)
Subjective:        Connie Mcgee is a 41 y.o. female here for a routine exam.  Current complaints: Vaginal discharge.    Personal health questionnaire:  Is patient Ashkenazi Jewish, have a family history of breast and/or ovarian cancer: no Is there a family history of uterine cancer diagnosed at age < 27, gastrointestinal cancer, urinary tract cancer, family member who is a Field seismologist syndrome-associated carrier: no Is the patient overweight and hypertensive, family history of diabetes, personal history of gestational diabetes, preeclampsia or PCOS: no Is patient over 75, have PCOS,  family history of premature CHD under age 36, diabetes, smoke, have hypertension or peripheral artery disease:  no At any time, has a partner hit, kicked or otherwise hurt or frightened you?: no Over the past 2 weeks, have you felt down, depressed or hopeless?: no Over the past 2 weeks, have you felt little interest or pleasure in doing things?:no   Gynecologic History Patient's last menstrual period was 05/03/2022. Contraception: IUD Last Pap: 2021. Results were: normal Last mammogram: none. Results were: none  Obstetric History OB History  Gravida Para Term Preterm AB Living  '4 4 1     4  ' SAB IAB Ectopic Multiple Live Births          4    # Outcome Date GA Lbr Len/2nd Weight Sex Delivery Anes PTL Lv  4 Term 04/19/21 [redacted]w[redacted]d  M Vag-Spont   LIV  3 Para 10/10/07    F CS-LTranv   LIV  2 Para 08/17/06    M Vag-Spont   LIV  1 Para 04/25/97    M Vag-Spont   LIV    Past Medical History:  Diagnosis Date   Anemia     Past Surgical History:  Procedure Laterality Date   CESAREAN SECTION       Current Outpatient Medications:    acetaminophen (TYLENOL) 325 MG tablet, Take 2 tablets (650 mg total) by mouth every 6 (six) hours as needed for mild pain, moderate pain, fever or headache., Disp: , Rfl:    ibuprofen (ADVIL) 600 MG tablet, Take 1 tablet (600 mg total) by mouth every 6 (six) hours., Disp:  30 tablet, Rfl: 0   Blood Glucose Monitoring Suppl (ACCU-CHEK GUIDE) w/Device KIT, 1 Device by Does not apply route 4 (four) times daily., Disp: 1 kit, Rfl: 0   coconut oil OIL, Apply 1 application topically as needed (nipple pain)., Disp: , Rfl: 0   medroxyPROGESTERone (PROVERA) 10 MG tablet, Take 1 tablet (10 mg total) by mouth daily., Disp: 10 tablet, Rfl: 0   Prenatal Vit-Fe Fumarate-FA (MULTIVITAMIN-PRENATAL) 27-0.8 MG TABS tablet, Take 1 tablet by mouth daily at 12 noon., Disp: , Rfl:  No Known Allergies  Social History   Tobacco Use   Smoking status: Every Day   Smokeless tobacco: Never   Tobacco comments:    2-3 cigs/day  Substance Use Topics   Alcohol use: Not Currently    Family History  Problem Relation Age of Onset   Diabetes Mother       Review of Systems  Constitutional: negative for fatigue and weight loss Respiratory: negative for cough and wheezing Cardiovascular: negative for chest pain, fatigue and palpitations Gastrointestinal: negative for abdominal pain and change in bowel habits Musculoskeletal:negative for myalgias Neurological: negative for gait problems and tremors Behavioral/Psych: negative for abusive relationship, depression Endocrine: negative for temperature intolerance    Genitourinary:negative for abnormal menstrual periods, genital lesions, hot flashes, sexual problems and  vaginal discharge Integument/breast: negative for breast lump, breast tenderness, nipple discharge and skin lesion(s)    Objective:       BP 119/87   Pulse 85   Ht '5\' 2"'  (1.575 m)   Wt 175 lb (79.4 kg)   LMP 05/03/2022   BMI 32.01 kg/m  General:   Alert and no distress  Skin:   no rash or abnormalities  Lungs:   clear to auscultation bilaterally  Heart:   regular rate and rhythm, S1, S2 normal, no murmur, click, rub or gallop  Breasts:   normal without suspicious masses, skin or nipple changes or axillary nodes  Abdomen:  normal findings: no organomegaly, soft,  non-tender and no hernia  Pelvis:  External genitalia: normal general appearance Urinary system: urethral meatus normal and bladder without fullness, nontender Vaginal: normal without tenderness, induration or masses Cervix: normal appearance Adnexa: normal bimanual exam Uterus: anteverted and non-tender, normal size   Lab Review Urine pregnancy test Labs reviewed yes Radiologic studies reviewed no  I have spent a total of 20 minutes of face-to-face ime, excluding clinical staff time, reviewing notes and preparing to see patient, ordering tests and/or medications, and counseling the patient.   Assessment:    .1. Encounter for gynecological examination with Papanicolaou smear of cervix Rx: - Cytology - PAP( [place])  2. Vaginal discharge Rx: - Cervicovaginal ancillary only( [place])  3. Screening for STD (sexually transmitted disease) Rx: - Hepatitis B surface antigen - Hepatitis C antibody - HIV Antibody (routine testing w rflx) - RPR     Plan:    Education reviewed: calcium supplements, depression evaluation, low fat, low cholesterol diet, safe sex/STD prevention, self breast exams, and weight bearing exercise. Contraception: IUD. Mammogram ordered. Follow up in: 1 year.    Orders Placed This Encounter  Procedures   Hepatitis B surface antigen   Hepatitis C antibody   HIV Antibody (routine testing w rflx)   RPR     Shelly Bombard, MD 05/15/2022 9:10 PM

## 2022-05-16 LAB — CERVICOVAGINAL ANCILLARY ONLY
Bacterial Vaginitis (gardnerella): POSITIVE — AB
Candida Glabrata: NEGATIVE
Candida Vaginitis: NEGATIVE
Chlamydia: NEGATIVE
Comment: NEGATIVE
Comment: NEGATIVE
Comment: NEGATIVE
Comment: NEGATIVE
Comment: NEGATIVE
Comment: NORMAL
Neisseria Gonorrhea: NEGATIVE
Trichomonas: NEGATIVE

## 2022-05-16 LAB — HEPATITIS B SURFACE ANTIGEN: Hepatitis B Surface Ag: NEGATIVE

## 2022-05-16 LAB — HEPATITIS C ANTIBODY: Hep C Virus Ab: NONREACTIVE

## 2022-05-16 LAB — RPR: RPR Ser Ql: NONREACTIVE

## 2022-05-16 LAB — HIV ANTIBODY (ROUTINE TESTING W REFLEX): HIV Screen 4th Generation wRfx: NONREACTIVE

## 2022-05-17 ENCOUNTER — Telehealth: Payer: Self-pay | Admitting: Emergency Medicine

## 2022-05-17 ENCOUNTER — Other Ambulatory Visit: Payer: Self-pay | Admitting: Obstetrics

## 2022-05-17 DIAGNOSIS — B9689 Other specified bacterial agents as the cause of diseases classified elsewhere: Secondary | ICD-10-CM

## 2022-05-17 LAB — CYTOLOGY - PAP
Comment: NEGATIVE
Diagnosis: UNDETERMINED — AB
High risk HPV: NEGATIVE

## 2022-05-17 MED ORDER — METRONIDAZOLE 500 MG PO TABS: 500.0000 mg | ORAL_TABLET | Freq: Two times a day (BID) | ORAL | 2 refills | Status: AC

## 2022-05-17 NOTE — Telephone Encounter (Signed)
TC to patient about results.

## 2022-05-18 ENCOUNTER — Telehealth: Payer: Self-pay | Admitting: Emergency Medicine

## 2022-05-18 NOTE — Telephone Encounter (Signed)
TC to patient re pap results.

## 2022-06-05 IMAGING — US US MFM OB FOLLOW-UP
1 series · 13 of 28 positions shown · non-contrast
Comparison: none

[Series 1: us mfm ob follow-up · 51 acquisitions, 13 frames shown]
[im 2/51]
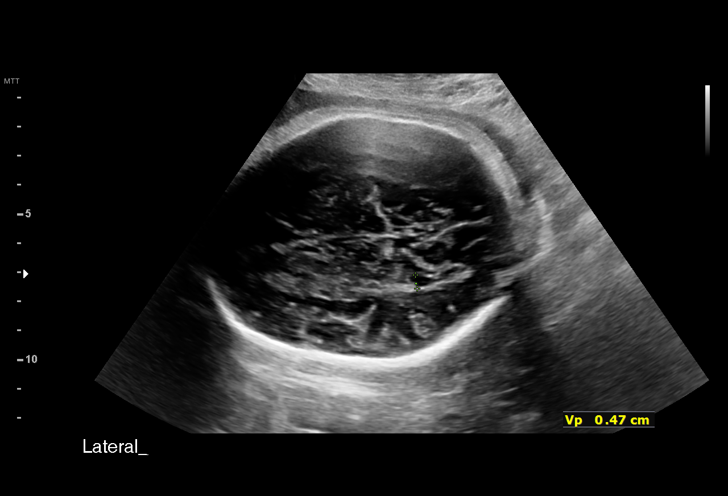
[im 6/51]
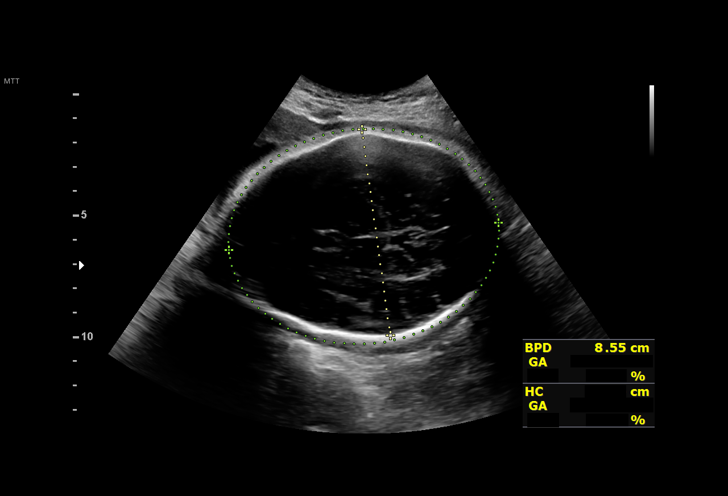
[im 10/51]
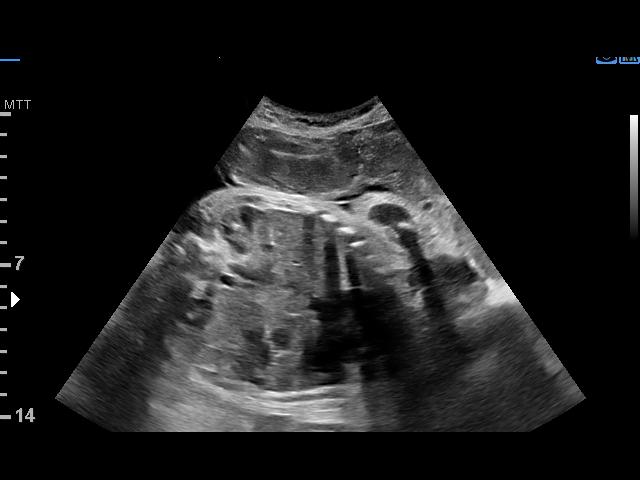
[im 13/51]
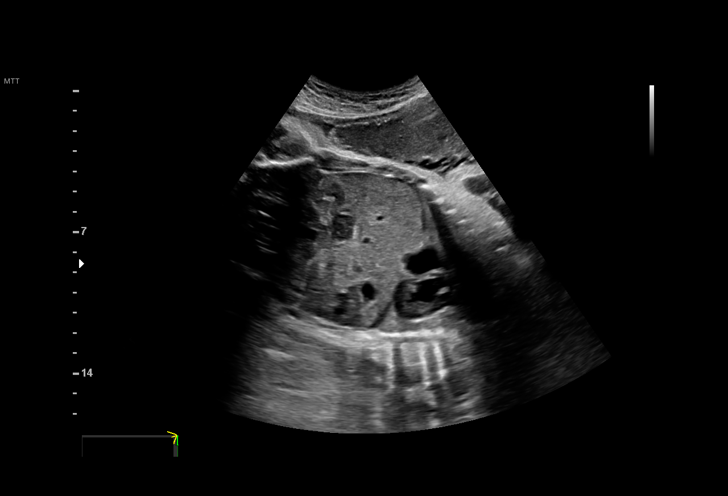
[im 17/51]
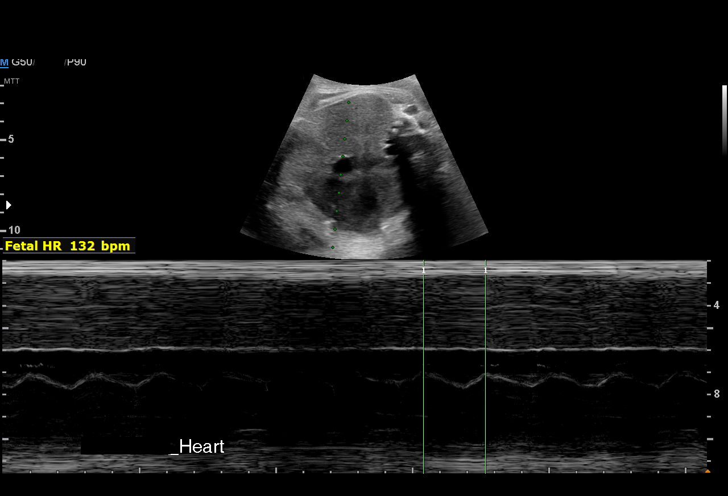
[im 21/51]
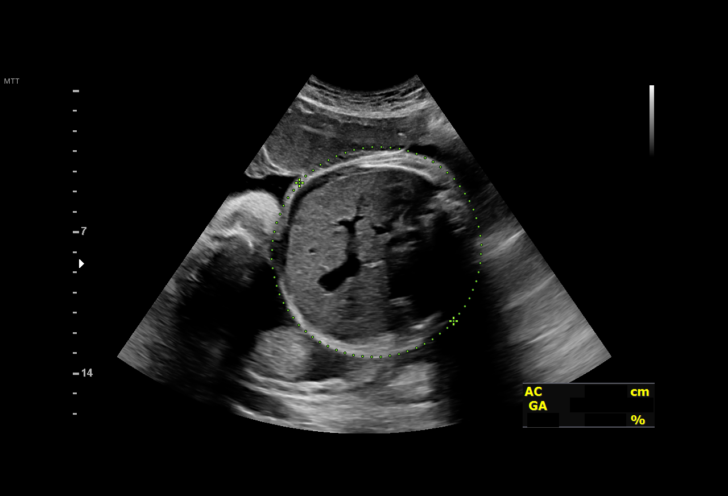
[im 26/51]
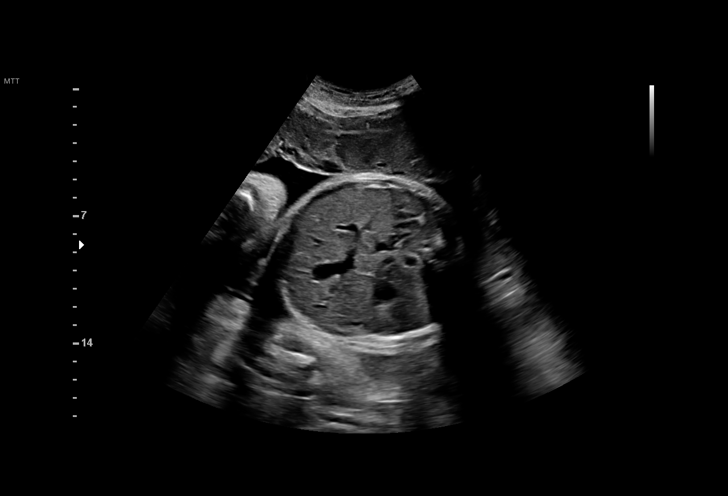
[im 30/51]
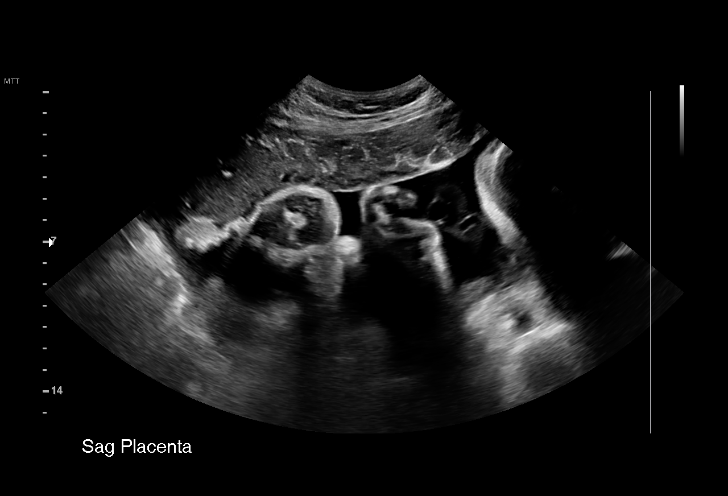
[im 34/51]
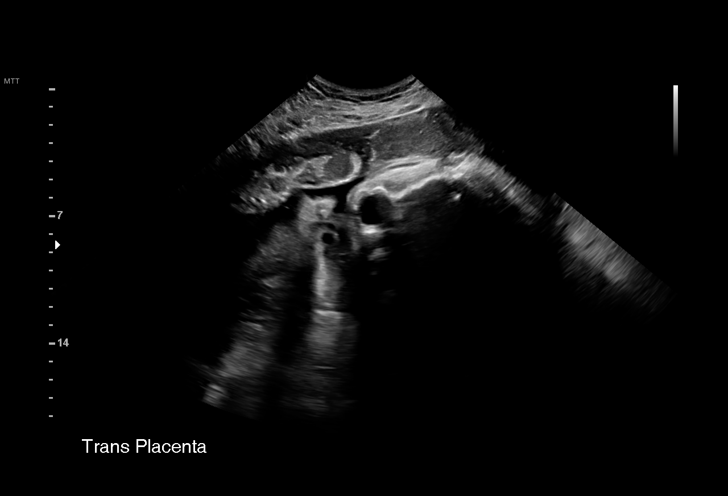
[im 38/51]
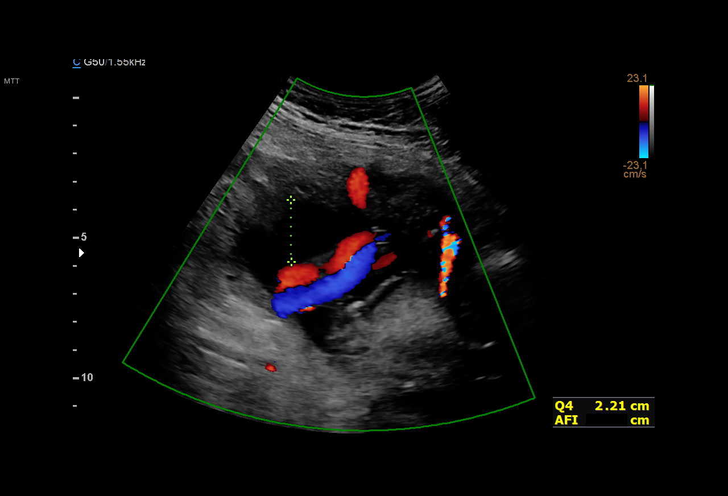
[im 41/51]
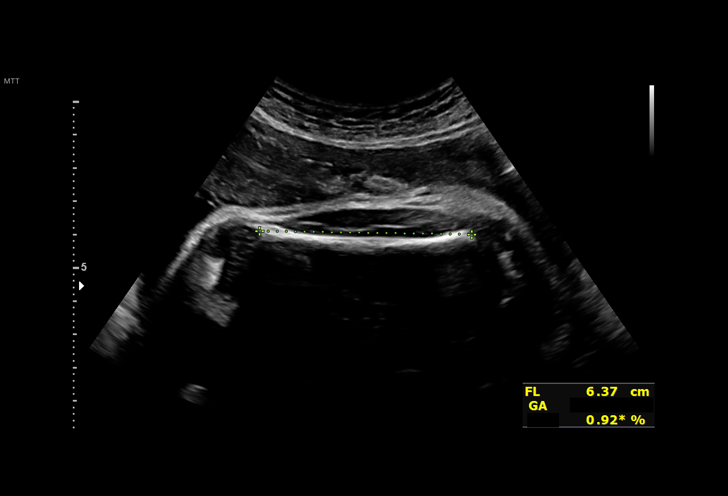
[im 45/51]
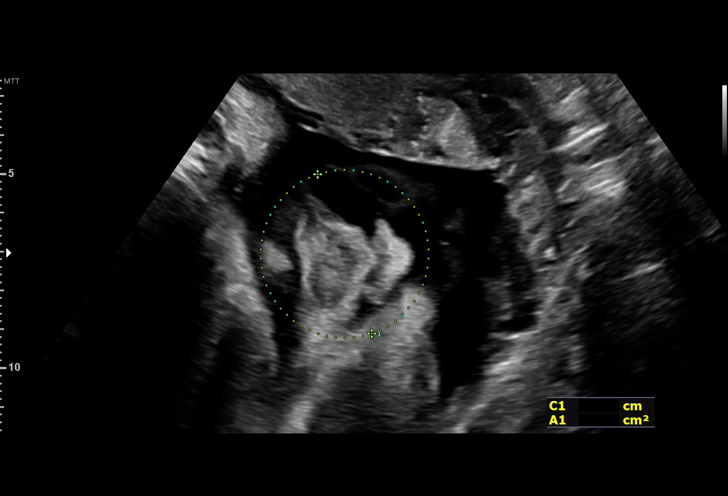
[im 49/51]
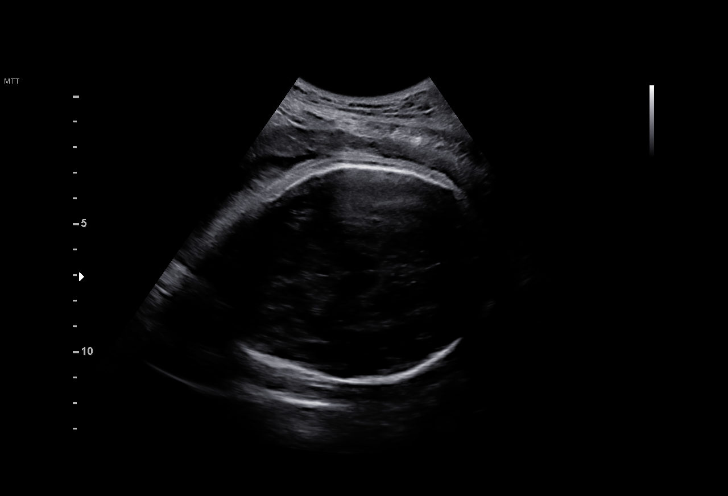

[13 of 28 positions shown; findings below may reference images not displayed]

[REDACTED]
                   [id number]

                                                      CHOUAA

Indications

 Gestational diabetes in pregnancy, diet
 controlled
 36 weeks gestation of pregnancy
 Advanced maternal age multigravida 35+,
 third trimester (39 yrs)
 Late to prenatal care, third trimester
 Poor obstetric history: Previous fetal growth
 restriction (FGR)
 History of cesarean delivery, currently
 pregnant x 1
 Tobacco use complicating pregnancy, third
 trimester
 Encounter for other antenatal screening
 follow-up
Fetal Evaluation

 Num Of Fetuses:         1
 Fetal Heart Rate(bpm):  132
 Cardiac Activity:       Observed
 Presentation:           Cephalic
 Placenta:               Anterior
 P. Cord Insertion:      Previously Visualized
 Amniotic Fluid
 AFI FV:      Within normal limits

 AFI Sum(cm)     %Tile       Largest Pocket(cm)
 13.6            49

 RUQ(cm)       RLQ(cm)       LUQ(cm)        LLQ(cm)
 7.4           2.2           4              0
Biometry

 BPD:      84.8  mm     G. Age:  34w 1d         10  %    CI:        72.77   %    70 - 86
                                                         FL/HC:      20.4   %    20.1 -
 HC:      316.1  mm     G. Age:  35w 4d         10  %    HC/AC:      0.97        0.93 -
 AC:      327.4  mm     G. Age:  36w 5d         75  %    FL/BPD:     75.9   %    71 - 87
 FL:       64.4  mm     G. Age:  33w 2d        1.6  %    FL/AC:      19.7   %    20 - 24

 LV:        4.7  mm

 Est. FW:    0442  gm    5 lb 14 oz      31  %
OB History

 Gravidity:    4         Term:   3        Prem:   0        SAB:   0
 TOP:          0       Ectopic:  0        Living: 3
Gestational Age

 LMP:           36w 1d        Date:  07/19/20                 EDD:   04/25/21
 U/S Today:     35w 0d                                        EDD:   05/03/21
 Best:          36w 1d     Det. By:  LMP  (07/19/20)          EDD:   04/25/21
Anatomy

 Cranium:               Appears normal         Aortic Arch:            Previously seen
 Cavum:                 Previously seen        Ductal Arch:            Previously seen
 Ventricles:            Appears normal         Diaphragm:              Appears normal
 Choroid Plexus:        Previously seen        Stomach:                Appears normal, left
                                                                       sided
 Cerebellum:            Previously seen        Abdomen:                Previously seen
 Posterior Fossa:       Previously seen        Abdominal Wall:         Previously seen
 Nuchal Fold:           Not applicable (>20    Cord Vessels:           Previously seen
                        wks GA)
 Face:                  Orbits and profile     Kidneys:                Appear normal
                        previously seen
 Lips:                  Previously seen        Bladder:                Appears normal
 Thoracic:              Appears normal         Spine:                  Limited views
                                                                       previously seen
 Heart:                 Appears normal         Upper Extremities:      Previously seen
                        (4CH, axis, and
                        situs)
 RVOT:                  Previously seen        Lower Extremities:      Previously seen
 LVOT:                  Previously seen

 Other:  Male gender previously seen. Technically difficult due to advanced
         GA and fetal position.
Cervix Uterus Adnexa
 Cervix
 Not visualized (advanced GA >03wks)

 Uterus
 No abnormality visualized.

 Right Ovary
 Within normal limits.

 Left Ovary
 Within normal limits.

 Cul De Sac
 No free fluid seen.

 Adnexa
 No abnormality visualized.
Comments

 This patient was seen for a follow up growth scan due to diet-
 controlled gestational diabetes.  She denies any problems
 since her last exam.
 She was informed that the fetal growth and amniotic fluid
 level appears appropriate for her gestational age.
 As the fetal growth is within normal limits, no further exams
 were scheduled in our office.
 Weekly fetal testing should be started should she require
 insulin or metformin for treatment of her gestational diabetes.

## 2023-05-31 ENCOUNTER — Ambulatory Visit: Payer: Medicaid Other | Admitting: Obstetrics

## 2023-07-23 ENCOUNTER — Other Ambulatory Visit (HOSPITAL_COMMUNITY)
Admission: RE | Admit: 2023-07-23 | Discharge: 2023-07-23 | Disposition: A | Discharge status: Home / Self Care | Payer: Medicaid Other | Source: Ambulatory Visit | Attending: Obstetrics | Admitting: Obstetrics

## 2023-07-23 ENCOUNTER — Encounter: Payer: Self-pay | Admitting: Obstetrics & Gynecology

## 2023-07-23 ENCOUNTER — Ambulatory Visit (INDEPENDENT_AMBULATORY_CARE_PROVIDER_SITE_OTHER): Payer: Medicaid Other | Admitting: Obstetrics & Gynecology

## 2023-07-23 VITALS — BP 154/93 | HR 69 | Ht 62.5 in | Wt 176.0 lb

## 2023-07-23 DIAGNOSIS — Z01419 Encounter for gynecological examination (general) (routine) without abnormal findings: Secondary | ICD-10-CM | POA: Diagnosis not present

## 2023-07-23 DIAGNOSIS — Z1339 Encounter for screening examination for other mental health and behavioral disorders: Secondary | ICD-10-CM

## 2023-07-23 NOTE — Progress Notes (Signed)
42 y.o. GYN presents for AEX/PAP/STD screening.  BP is elevated today.

## 2023-07-23 NOTE — Progress Notes (Signed)
Patient ID: Connie Mcgee, female   DOB: 10-31-1981, 42 y.o.   MRN: [id number]  Chief Complaint  Patient presents with   Gynecologic Exam    HPI Connie Mcgee is a 42 y.o. female.  [id number] Patient's last menstrual period was 07/11/2023 (approximate). Her last period continued for 11 days and spotting just stopped. Menses are regular and usually last for 7 days with some spotting. IUD in place since 2022 HPI  Past Medical History:  Diagnosis Date   Anemia     Past Surgical History:  Procedure Laterality Date   CESAREAN SECTION      Family History  Problem Relation Age of Onset   Diabetes Mother     Social History Social History   Tobacco Use   Smoking status: Every Day    Types: Cigarettes   Smokeless tobacco: Never   Tobacco comments:    2-3 cigs/day  Vaping Use   Vaping status: Never Used  Substance Use Topics   Alcohol use: Yes    Comment: SOMETIMES   Drug use: Not Currently    No Known Allergies  Current Outpatient Medications  Medication Sig Dispense Refill   acetaminophen (TYLENOL) 325 MG tablet Take 2 tablets (650 mg total) by mouth every 6 (six) hours as needed for mild pain, moderate pain, fever or headache.     Blood Glucose Monitoring Suppl (ACCU-CHEK GUIDE) w/Device KIT 1 Device by Does not apply route 4 (four) times daily. (Patient not taking: Reported on 07/23/2023) 1 kit 0   coconut oil OIL Apply 1 application topically as needed (nipple pain).  0   ibuprofen (ADVIL) 600 MG tablet Take 1 tablet (600 mg total) by mouth every 6 (six) hours. 30 tablet 0   medroxyPROGESTERone (PROVERA) 10 MG tablet Take 1 tablet (10 mg total) by mouth daily. (Patient not taking: Reported on 07/23/2023) 10 tablet 0   metroNIDAZOLE (FLAGYL) 500 MG tablet Take 1 tablet (500 mg total) by mouth 2 (two) times daily. (Patient not taking: Reported on 07/23/2023) 14 tablet 2   Prenatal Vit-Fe Fumarate-FA (MULTIVITAMIN-PRENATAL) 27-0.8 MG TABS tablet Take 1 tablet by  mouth daily at 12 noon.     No current facility-administered medications for this visit.    Review of Systems Review of Systems  Constitutional: Negative.   Respiratory: Negative.    Cardiovascular: Negative.   Gastrointestinal: Negative.   Genitourinary:  Positive for menstrual problem. Negative for pelvic pain and vaginal bleeding.    Blood pressure (!) 154/93, pulse 69, height 5' 2.5" (1.588 m), weight 176 lb (79.8 kg), last menstrual period 07/11/2023.  Physical Exam Physical Exam Vitals and nursing note reviewed. Exam conducted with a chaperone present.  Constitutional:      Appearance: She is obese. She is not ill-appearing.  Cardiovascular:     Rate and Rhythm: Normal rate.  Pulmonary:     Effort: Pulmonary effort is normal.  Abdominal:     General: Abdomen is flat.     Palpations: Abdomen is soft.  Genitourinary:    General: Normal vulva.     Exam position: Lithotomy position.     Vagina: Normal.     Cervix: Normal.     Uterus: Normal.      Adnexa: Right adnexa normal and left adnexa normal.  Musculoskeletal:        General: Normal range of motion.  Skin:    General: Skin is warm and dry.  Neurological:     Mental Status: She is alert.  Psychiatric:  Mood and Affect: Mood normal.        Behavior: Behavior normal.     Data Reviewed Pap 2023 ASCUS neg HPV  Assessment Well woman exam with routine gynecological exam - Plan: MM 3D SCREENING MAMMOGRAM BILATERAL BREAST, Hepatitis C Antibody, Hepatitis B Surface AntiGEN, RPR, Cervicovaginal ancillary only( [place]), HIV antibody (with reflex), FSH/LH  Menses irregularity Plan F/u on labs, mammogram scheduled Pap in 2026    Scheryl Darter 07/23/2023, 3:01 PM

## 2023-07-31 ENCOUNTER — Ambulatory Visit: Payer: Medicaid Other

## 2023-08-14 NOTE — Progress Notes (Signed)
Labs are normal with hormone level in the premenopausal range

## 2024-07-29 ENCOUNTER — Ambulatory Visit: Admitting: Obstetrics

## 2024-07-29 ENCOUNTER — Encounter: Payer: Self-pay | Admitting: Obstetrics

## 2024-07-29 ENCOUNTER — Other Ambulatory Visit (HOSPITAL_COMMUNITY)
Admission: RE | Admit: 2024-07-29 | Discharge: 2024-07-29 | Disposition: A | Discharge status: Home / Self Care | Source: Ambulatory Visit | Attending: Obstetrics | Admitting: Obstetrics

## 2024-07-29 VITALS — BP 139/86 | HR 77 | Wt 175.5 lb

## 2024-07-29 DIAGNOSIS — F17200 Nicotine dependence, unspecified, uncomplicated: Secondary | ICD-10-CM

## 2024-07-29 DIAGNOSIS — Z113 Encounter for screening for infections with a predominantly sexual mode of transmission: Secondary | ICD-10-CM

## 2024-07-29 DIAGNOSIS — Z01419 Encounter for gynecological examination (general) (routine) without abnormal findings: Secondary | ICD-10-CM | POA: Diagnosis present

## 2024-07-29 DIAGNOSIS — R8290 Unspecified abnormal findings in urine: Secondary | ICD-10-CM

## 2024-07-29 DIAGNOSIS — N898 Other specified noninflammatory disorders of vagina: Secondary | ICD-10-CM | POA: Insufficient documentation

## 2024-07-29 DIAGNOSIS — Z1239 Encounter for other screening for malignant neoplasm of breast: Secondary | ICD-10-CM

## 2024-07-29 DIAGNOSIS — T8332XA Displacement of intrauterine contraceptive device, initial encounter: Secondary | ICD-10-CM

## 2024-07-29 MED ORDER — NITROFURANTOIN MONOHYD MACRO 100 MG PO CAPS: 100.0000 mg | ORAL_CAPSULE | Freq: Two times a day (BID) | ORAL | 0 refills | Status: AC

## 2024-07-29 NOTE — Progress Notes (Signed)
 Subjective:        Connie Mcgee is a 43 y.o. female here for a routine exam.  Current complaints: Vaginal discharge and malodorous urine..    Personal health questionnaire:  Is patient Ashkenazi Jewish, have a family history of breast and/or ovarian cancer: no Is there a family history of uterine cancer diagnosed at age < 83, gastrointestinal cancer, urinary tract cancer, family member who is a Personnel officer syndrome-associated carrier: no Is the patient overweight and hypertensive, family history of diabetes, personal history of gestational diabetes, preeclampsia or PCOS: no Is patient over 40, have PCOS,  family history of premature CHD under age 24, diabetes, smoke, have hypertension or peripheral artery disease:  no At any time, has a partner hit, kicked or otherwise hurt or frightened you?: no Over the past 2 weeks, have you felt down, depressed or hopeless?: no Over the past 2 weeks, have you felt little interest or pleasure in doing things?:no   Gynecologic History Patient's last menstrual period was 07/22/2024. Contraception: IUD Last Pap: 2023. Results were: ASCUS with negative High Risk HPV Last mammogram: none. Results were: none  Obstetric History OB History  Gravida Para Term Preterm AB Living  4 4 4   4   SAB IAB Ectopic Multiple Live Births      4    # Outcome Date GA Lbr Len/2nd Weight Sex Type Anes PTL Lv  4 Term 04/19/21 [redacted]w[redacted]d   M Vag-Spont   LIV  3 Term 10/10/07    F CS-LTranv   LIV  2 Term 08/17/06    CHRISTELLA Vag-Spont   LIV  1 Term 04/25/97    CHRISTELLA Pitman   LIV    Past Medical History:  Diagnosis Date   Anemia     Past Surgical History:  Procedure Laterality Date   CESAREAN SECTION       Current Outpatient Medications:    levonorgestrel  (MIRENA ) 20 MCG/DAY IUD, 1 each by Intrauterine route once., Disp: , Rfl:    nitrofurantoin , macrocrystal-monohydrate, (MACROBID ) 100 MG capsule, Take 1 capsule (100 mg total) by mouth 2 (two) times daily., Disp: 14  capsule, Rfl: 0   acetaminophen  (TYLENOL ) 325 MG tablet, Take 2 tablets (650 mg total) by mouth every 6 (six) hours as needed for mild pain, moderate pain, fever or headache. (Patient not taking: Reported on 07/29/2024), Disp: , Rfl:    Blood Glucose Monitoring Suppl (ACCU-CHEK GUIDE) w/Device KIT, 1 Device by Does not apply route 4 (four) times daily. (Patient not taking: Reported on 07/29/2024), Disp: 1 kit, Rfl: 0   coconut oil OIL, Apply 1 application topically as needed (nipple pain). (Patient not taking: Reported on 07/29/2024), Disp: , Rfl: 0   ibuprofen  (ADVIL ) 600 MG tablet, Take 1 tablet (600 mg total) by mouth every 6 (six) hours. (Patient not taking: Reported on 07/29/2024), Disp: 30 tablet, Rfl: 0   medroxyPROGESTERone  (PROVERA ) 10 MG tablet, Take 1 tablet (10 mg total) by mouth daily. (Patient not taking: Reported on 07/29/2024), Disp: 10 tablet, Rfl: 0   metroNIDAZOLE  (FLAGYL ) 500 MG tablet, Take 1 tablet (500 mg total) by mouth 2 (two) times daily. (Patient not taking: Reported on 07/29/2024), Disp: 14 tablet, Rfl: 2   Prenatal Vit-Fe Fumarate-FA (MULTIVITAMIN-PRENATAL) 27-0.8 MG TABS tablet, Take 1 tablet by mouth daily at 12 noon. (Patient not taking: Reported on 07/29/2024), Disp: , Rfl:  No Known Allergies  Social History   Tobacco Use   Smoking status: Every Day    Types: Cigarettes  Smokeless tobacco: Never   Tobacco comments:    2-3 cigs/day  Substance Use Topics   Alcohol use: Yes    Comment: SOMETIMES    Family History  Problem Relation Age of Onset   Diabetes Mother       Review of Systems  Constitutional: negative for fatigue and weight loss Respiratory: negative for cough and wheezing Cardiovascular: negative for chest pain, fatigue and palpitations Gastrointestinal: negative for abdominal pain and change in bowel habits Musculoskeletal:negative for myalgias Neurological: negative for gait problems and tremors Behavioral/Psych: negative for abusive  relationship, depression Endocrine: negative for temperature intolerance    Genitourinary: positive for vaginal discharge and malodorous urine.  negative for abnormal menstrual periods, genital lesions, hot flashes, sexual problems  Integument/breast: negative for breast lump, breast tenderness, nipple discharge and skin lesion(s)    Objective:       BP 139/86   Pulse 77   Wt 175 lb 8 oz (79.6 kg)   LMP 07/22/2024   BMI 31.59 kg/m  General:   Alert and no distress  Skin:   no rash or abnormalities  Lungs:   clear to auscultation bilaterally  Heart:   regular rate and rhythm, S1, S2 normal, no murmur, click, rub or gallop  Breasts:   normal without suspicious masses, skin or nipple changes or axillary nodes  Abdomen:  normal findings: no organomegaly, soft, non-tender and no hernia  Pelvis:  External genitalia: normal general appearance Urinary system: urethral meatus normal and bladder without fullness, nontender Vaginal: normal without tenderness, induration or masses Cervix: normal appearance Adnexa: normal bimanual exam Uterus: anteverted and non-tender, normal size   Lab Review Urine pregnancy test Labs reviewed yes Radiologic studies reviewed no  I have spent a total of 20 minutes of face-to-face time, excluding clinical staff time, reviewing notes and preparing to see patient, ordering tests and/or medications, and counseling the patient.    Assessment:    1. Encounter for gynecological examination with Papanicolaou smear of cervix (Primary) Rx: - Cytology - PAP( [facility])  2. Vaginal discharge Rx: - Cervicovaginal ancillary only( [facility])  3. Screening examination for STI Rx: - HIV antibody (with reflex) - RPR - Hepatitis C Antibody - Hepatitis B Surface AntiGEN  4. Screening breast examination Rx: - MM 3D DIAGNOSTIC MAMMOGRAM BILATERAL BREAST; Future  5. Malodorous urine Rx: - Urine Culture - nitrofurantoin , macrocrystal-monohydrate,  (MACROBID ) 100 MG capsule; Take 1 capsule (100 mg total) by mouth 2 (two) times daily.  Dispense: 14 capsule; Refill: 0  6. Intrauterine contraceptive device threads lost, initial encounter Rx: - levonorgestrel  (MIRENA ) 20 MCG/DAY IUD; 1 each by Intrauterine route once. - US  PELVIC COMPLETE WITH TRANSVAGINAL; Future  7. Tobacco dependency - cessation recommended     Plan:    Education reviewed: calcium supplements, depression evaluation, low fat, low cholesterol diet, safe sex/STD prevention, self breast exams, smoking cessation, and weight bearing exercise. Contraception: IUD. Mammogram ordered. Follow up in: 1 year.   Meds ordered this encounter  Medications   nitrofurantoin , macrocrystal-monohydrate, (MACROBID ) 100 MG capsule    Sig: Take 1 capsule (100 mg total) by mouth 2 (two) times daily.    Dispense:  14 capsule    Refill:  0   Orders Placed This Encounter  Procedures   Urine Culture   MM 3D DIAGNOSTIC MAMMOGRAM BILATERAL BREAST    Standing Status:   Future    Expiration Date:   07/29/2025    Reason for Exam (SYMPTOM  OR DIAGNOSIS REQUIRED):  Screening    Is the patient pregnant?:   No    Preferred imaging location?:   [facility]   US  PELVIC COMPLETE WITH TRANSVAGINAL    Standing Status:   Future    Expiration Date:   09/28/2025    Reason for Exam (SYMPTOM  OR DIAGNOSIS REQUIRED):   IUD strings lost.    Preferred imaging location?:   [facility]   HIV antibody (with reflex)   RPR   Hepatitis C Antibody   Hepatitis B Surface AntiGEN    CARLIN RONAL CENTERS, MD, FACOG Attending Obstetrician & Gynecologist, [facility] for [facility], [facility], [place] 07/29/2024

## 2024-07-29 NOTE — Progress Notes (Signed)
 Pt is in the office for annual Pt has never had a mammogram Currently has IUD, inserted 2022 LMP 07/22/2024 Last pap 05/15/2022 Pt reports odor with urination

## 2024-07-30 LAB — CERVICOVAGINAL ANCILLARY ONLY
Bacterial Vaginitis (gardnerella): NEGATIVE
Candida Glabrata: NEGATIVE
Candida Vaginitis: NEGATIVE
Chlamydia: NEGATIVE
Comment: NEGATIVE
Comment: NEGATIVE
Comment: NEGATIVE
Comment: NEGATIVE
Comment: NEGATIVE
Comment: NORMAL
Neisseria Gonorrhea: NEGATIVE
Trichomonas: NEGATIVE

## 2024-07-31 LAB — HEPATITIS B SURFACE ANTIGEN: Hepatitis B Surface Ag: NEGATIVE

## 2024-07-31 LAB — RPR: RPR Ser Ql: NONREACTIVE

## 2024-07-31 LAB — HEPATITIS C ANTIBODY: Hep C Virus Ab: NONREACTIVE

## 2024-07-31 LAB — HIV ANTIBODY (ROUTINE TESTING W REFLEX): HIV Screen 4th Generation wRfx: NONREACTIVE

## 2024-08-03 LAB — CYTOLOGY - PAP
Comment: NEGATIVE
Diagnosis: NEGATIVE
High risk HPV: NEGATIVE

## 2024-08-05 ENCOUNTER — Ambulatory Visit: Payer: Self-pay | Admitting: Family Medicine

## 2024-08-18 ENCOUNTER — Ambulatory Visit
Admission: RE | Admit: 2024-08-18 | Discharge: 2024-08-18 | Disposition: A | Discharge status: Home / Self Care | Source: Ambulatory Visit | Attending: Obstetrics | Admitting: Obstetrics

## 2024-08-18 ENCOUNTER — Ambulatory Visit (HOSPITAL_COMMUNITY)

## 2024-08-18 DIAGNOSIS — Z1239 Encounter for other screening for malignant neoplasm of breast: Secondary | ICD-10-CM

## 2024-08-27 ENCOUNTER — Ambulatory Visit (HOSPITAL_COMMUNITY)

## 2024-09-02 ENCOUNTER — Ambulatory Visit (HOSPITAL_COMMUNITY)
Admission: RE | Admit: 2024-09-02 | Discharge: 2024-09-02 | Disposition: A | Discharge status: Home / Self Care | Source: Ambulatory Visit | Attending: Obstetrics | Admitting: Obstetrics

## 2024-09-02 DIAGNOSIS — T8332XA Displacement of intrauterine contraceptive device, initial encounter: Secondary | ICD-10-CM | POA: Diagnosis present
